# Patient Record
Sex: Male | Born: 1980 | Race: Black or African American | Hispanic: No | Marital: Married | Smoking: Former smoker
Health system: Southern US, Community
[De-identification: ages and names within clinical notes are randomized; demographics above are authoritative.]

## PROBLEM LIST (undated history)

## (undated) DIAGNOSIS — G5611 Other lesions of median nerve, right upper limb: Secondary | ICD-10-CM

## (undated) DIAGNOSIS — E349 Endocrine disorder, unspecified: Secondary | ICD-10-CM

## (undated) DIAGNOSIS — N529 Male erectile dysfunction, unspecified: Secondary | ICD-10-CM

## (undated) DIAGNOSIS — E039 Hypothyroidism, unspecified: Secondary | ICD-10-CM

## (undated) DIAGNOSIS — R112 Nausea with vomiting, unspecified: Secondary | ICD-10-CM

## (undated) DIAGNOSIS — Z9889 Other specified postprocedural states: Secondary | ICD-10-CM

## (undated) DIAGNOSIS — E119 Type 2 diabetes mellitus without complications: Secondary | ICD-10-CM

## (undated) DIAGNOSIS — E2740 Unspecified adrenocortical insufficiency: Secondary | ICD-10-CM

## (undated) DIAGNOSIS — I10 Essential (primary) hypertension: Secondary | ICD-10-CM

## (undated) DIAGNOSIS — K219 Gastro-esophageal reflux disease without esophagitis: Secondary | ICD-10-CM

## (undated) DIAGNOSIS — Z8709 Personal history of other diseases of the respiratory system: Secondary | ICD-10-CM

## (undated) HISTORY — PX: OTHER SURGICAL HISTORY: SHX169

---
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
## 2005-11-30 ENCOUNTER — Emergency Department (HOSPITAL_COMMUNITY): Admission: EM | Admit: 2005-11-30 | Discharge: 2005-11-30 | Payer: Self-pay | Admitting: Emergency Medicine

## 2012-08-23 ENCOUNTER — Encounter (HOSPITAL_COMMUNITY): Payer: Self-pay | Admitting: Family Medicine

## 2012-08-23 ENCOUNTER — Emergency Department (HOSPITAL_COMMUNITY)
Admission: EM | Admit: 2012-08-23 | Discharge: 2012-08-23 | Disposition: A | Discharge status: Home / Self Care | Payer: 59 | Attending: Emergency Medicine | Admitting: Emergency Medicine

## 2012-08-23 DIAGNOSIS — J029 Acute pharyngitis, unspecified: Secondary | ICD-10-CM | POA: Insufficient documentation

## 2012-08-23 DIAGNOSIS — F17200 Nicotine dependence, unspecified, uncomplicated: Secondary | ICD-10-CM | POA: Insufficient documentation

## 2012-08-23 LAB — RAPID STREP SCREEN (MED CTR MEBANE ONLY): Streptococcus, Group A Screen (Direct): NEGATIVE

## 2012-08-23 MED ORDER — IBUPROFEN 800 MG PO TABS: 800.0000 mg | ORAL_TABLET | Freq: Three times a day (TID) | ORAL | Status: DC

## 2012-08-23 MED ORDER — KETOROLAC TROMETHAMINE 60 MG/2ML IM SOLN
60.0000 mg | Freq: Once | INTRAMUSCULAR | Status: AC
  Administered 2012-08-23: 60 mg via INTRAMUSCULAR
  Filled 2012-08-23: qty 2

## 2012-08-23 MED ORDER — PENICILLIN G BENZATHINE 1200000 UNIT/2ML IM SUSP
1.2000 10*6.[IU] | Freq: Once | INTRAMUSCULAR | Status: AC
  Administered 2012-08-23: 1.2 10*6.[IU] via INTRAMUSCULAR
  Filled 2012-08-23: qty 2

## 2012-08-23 NOTE — ED Provider Notes (Signed)
History     CSN: [id number]  Arrival date & time 08/23/12  1824   None     Chief Complaint  Patient presents with  . Sore Throat    (Consider location/radiation/quality/duration/timing/severity/associated sxs/prior treatment) HPI History provided by pt.   Pt presents w/ severe sore throat since yesterday.  Associated w/ fever; max temp 101.0.  No associated nasal congestion, rhinorrhea or cough.  Has not taken anything for sx.  No known sick contacts.  History reviewed. No pertinent past medical history.  History reviewed. No pertinent past surgical history.  History reviewed. No pertinent family history.  History  Substance Use Topics  . Smoking status: Current Some Day Smoker  . Smokeless tobacco: Not on file  . Alcohol Use: Yes      Review of Systems  All other systems reviewed and are negative.    Allergies  Review of patient's allergies indicates no known allergies.  Home Medications   Current Outpatient Rx  Name  Route  Sig  Dispense  Refill  . GUAIFENESIN 100 MG/5ML PO SYRP   Oral   Take 200 mg by mouth 3 (three) times daily as needed. For sore throat.         . IBUPROFEN 800 MG PO TABS   Oral   Take 1 tablet (800 mg total) by mouth 3 (three) times daily.   12 tablet   0     BP 150/80  Pulse 91  Temp 99.5 F (37.5 C) (Oral)  Resp 20  SpO2 96%  Physical Exam  Nursing note and vitals reviewed. Constitutional: He is oriented to person, place, and time. He appears well-developed and well-nourished. No distress.  HENT:  Head: Normocephalic and atraumatic.       Erythema soft palate, tonsils and posterior pharynx.   Mild, symmetric tonsillar edema w/out exudate.  Uvula mid-line and no trismus.   Eyes:       Normal appearance  Neck: Normal range of motion.  Cardiovascular: Normal rate and regular rhythm.   Pulmonary/Chest: Effort normal and breath sounds normal.  Musculoskeletal: Normal range of motion.  Lymphadenopathy:    He has cervical  adenopathy.  Neurological: He is alert and oriented to person, place, and time.  Psychiatric: He has a normal mood and affect. His behavior is normal.    ED Course  Procedures (including critical care time)   Labs Reviewed  RAPID STREP SCREEN   No results found.   1. Pharyngitis       MDM  31yo M presents w/ sore throat.  Based on centor criteria, will treat for strep pharyngitis, despite negative rapid strep screen. Pt received IM bicillin and I recommended ibuprofen for fever and pain at home.  Return precautions discussed.         Otilio Miu, PA-C 08/23/12 2238

## 2012-08-23 NOTE — ED Notes (Signed)
Patient states he has been having a sore throat and a fever of 101.5 with chills since yesterday. He has not taken anything for fever only some cough medicine. Denies emesis but has been nauseated. Denies muscle aches.

## 2012-08-23 NOTE — ED Notes (Signed)
Per pt sore throat since yesterday. Hx of strep and sts it feel the same.

## 2012-08-25 NOTE — ED Provider Notes (Signed)
Medical screening examination/treatment/procedure(s) were performed by non-physician practitioner and as supervising physician I was immediately available for consultation/collaboration.  Chaunda Vandergriff R. Alyzabeth Pontillo, MD 08/25/12 0050 

## 2012-12-25 ENCOUNTER — Emergency Department (INDEPENDENT_AMBULATORY_CARE_PROVIDER_SITE_OTHER)
Admission: EM | Admit: 2012-12-25 | Discharge: 2012-12-25 | Disposition: A | Discharge status: Home / Self Care | Payer: 59 | Source: Home / Self Care | Attending: Emergency Medicine | Admitting: Emergency Medicine

## 2012-12-25 ENCOUNTER — Encounter (HOSPITAL_COMMUNITY): Payer: Self-pay | Admitting: Emergency Medicine

## 2012-12-25 DIAGNOSIS — S139XXA Sprain of joints and ligaments of unspecified parts of neck, initial encounter: Secondary | ICD-10-CM

## 2012-12-25 DIAGNOSIS — M25519 Pain in unspecified shoulder: Secondary | ICD-10-CM

## 2012-12-25 DIAGNOSIS — M25512 Pain in left shoulder: Secondary | ICD-10-CM

## 2012-12-25 DIAGNOSIS — S161XXA Strain of muscle, fascia and tendon at neck level, initial encounter: Secondary | ICD-10-CM

## 2012-12-25 MED ORDER — CYCLOBENZAPRINE HCL 10 MG PO TABS: 10.0000 mg | ORAL_TABLET | Freq: Two times a day (BID) | ORAL | Status: DC | PRN

## 2012-12-25 MED ORDER — MELOXICAM 7.5 MG PO TABS: 7.5000 mg | ORAL_TABLET | Freq: Every day | ORAL | Status: AC

## 2012-12-25 NOTE — ED Notes (Signed)
Reports left shoulder pain that started Wednesday.  Patient unclear of injury, but had been tossing toddler in air.  Afterwards, toddler pushed up on patient, patient noted pain in the shoulder child was pushing on.

## 2012-12-25 NOTE — ED Provider Notes (Signed)
History     CSN: [id number]  Arrival date & time 12/25/12  1235   First MD Initiated Contact with Patient 12/25/12 1405      Chief Complaint  Patient presents with  . Shoulder Pain    (Consider location/radiation/quality/duration/timing/severity/associated sxs/prior treatment) Patient is a 32 y.o. male presenting with shoulder pain. The history is provided by the patient.  Shoulder Pain This is a new problem. The current episode started more than 2 days ago. The problem occurs constantly. The problem has not changed since onset.Pertinent negatives include no chest pain, no abdominal pain, no headaches and no shortness of breath. The symptoms are aggravated by exertion. Nothing relieves the symptoms. He has tried nothing for the symptoms. The treatment provided no relief.    History reviewed. No pertinent past medical history.  History reviewed. No pertinent past surgical history.  No family history on file.  History  Substance Use Topics  . Smoking status: Current Some Day Smoker  . Smokeless tobacco: Not on file  . Alcohol Use: Yes      Review of Systems  Constitutional: Positive for activity change. Negative for fever, chills, diaphoresis, appetite change, fatigue and unexpected weight change.  Respiratory: Negative for shortness of breath.   Cardiovascular: Negative for chest pain.  Gastrointestinal: Negative for abdominal pain.  Musculoskeletal: Negative for myalgias, back pain, joint swelling and arthralgias.  Skin: Negative for color change.  Neurological: Negative for weakness, numbness and headaches.    Allergies  Review of patient's allergies indicates no known allergies.  Home Medications   Current Outpatient Rx  Name  Route  Sig  Dispense  Refill  . cyclobenzaprine (FLEXERIL) 10 MG tablet   Oral   Take 1 tablet (10 mg total) by mouth 2 (two) times daily as needed for muscle spasms.   15 tablet   0   . guaifenesin (ROBITUSSIN) 100 MG/5ML syrup  Oral   Take 200 mg by mouth 3 (three) times daily as needed. For sore throat.         . meloxicam (MOBIC) 7.5 MG tablet   Oral   Take 1 tablet (7.5 mg total) by mouth daily.   10 tablet   0     BP 138/88  Pulse 60  Temp(Src) 98.3 F (36.8 C) (Oral)  Resp 20  SpO2 100%  Physical Exam  Nursing note and vitals reviewed. Constitutional: He appears well-nourished.  Pulmonary/Chest: Effort normal and breath sounds normal.  Musculoskeletal: He exhibits tenderness.       Left shoulder: He exhibits decreased range of motion, tenderness and pain. He exhibits no bony tenderness, no swelling, no effusion, no crepitus, no deformity, no laceration, no spasm, normal pulse and normal strength.       Arms: Neurological: He is alert.  Skin: No rash noted. No erythema.    ED Course  Procedures (including critical care time)  Labs Reviewed - No data to display No results found.   1. Shoulder pain, acute, left   2. Strain of anterolateral cervical muscle, initial encounter       MDM  Patient performing full range of motion- with left shoulder, exam is suggestive of a rotator cuff syndrome and supraspinatus tenderness. Work restrictions were provided the patient. Along with a course of meloxicam with a  muscle relaxer- with Flexeril encouraged patient to followup with the orthopedic provider.        Jimmie Molly, MD 12/25/12 1515

## 2013-04-26 ENCOUNTER — Encounter (HOSPITAL_COMMUNITY): Payer: Self-pay | Admitting: Emergency Medicine

## 2013-04-26 ENCOUNTER — Emergency Department (HOSPITAL_COMMUNITY)
Admission: EM | Admit: 2013-04-26 | Discharge: 2013-04-26 | Disposition: A | Discharge status: Home / Self Care | Payer: 59 | Source: Home / Self Care | Attending: Emergency Medicine | Admitting: Emergency Medicine

## 2013-04-26 DIAGNOSIS — M5431 Sciatica, right side: Secondary | ICD-10-CM

## 2013-04-26 DIAGNOSIS — M5430 Sciatica, unspecified side: Secondary | ICD-10-CM

## 2013-04-26 MED ORDER — METHYLPREDNISOLONE ACETATE 80 MG/ML IJ SUSP
80.0000 mg | Freq: Once | INTRAMUSCULAR | Status: AC
  Administered 2013-04-26: 80 mg via INTRAMUSCULAR

## 2013-04-26 MED ORDER — MELOXICAM 15 MG PO TABS: 15.0000 mg | ORAL_TABLET | Freq: Every day | ORAL | Status: DC

## 2013-04-26 MED ORDER — PREDNISONE 20 MG PO TABS: 20.0000 mg | ORAL_TABLET | Freq: Two times a day (BID) | ORAL | Status: DC

## 2013-04-26 MED ORDER — OXYCODONE-ACETAMINOPHEN 5-325 MG PO TABS: ORAL_TABLET | ORAL | Status: DC

## 2013-04-26 MED ORDER — KETOROLAC TROMETHAMINE 60 MG/2ML IM SOLN
60.0000 mg | Freq: Once | INTRAMUSCULAR | Status: AC
  Administered 2013-04-26: 60 mg via INTRAMUSCULAR

## 2013-04-26 NOTE — ED Provider Notes (Signed)
Chief Complaint:  No chief complaint on file.   History of Present Illness:   Hunter Huynh is a 32 year old male who has had a six-day history of pain in the entire right leg from the groin on down to the foot. Most of the pain is localized to the foot. It feels numb and tingly. The pain is rated 10 worst and now is a 5-6/10. The leg feels somewhat weak. The patient states the foot feels puffy, but I cannot see any definite swelling. The pain is worse with standing or with sitting and nothing makes it better. He had some back pain about 2 weeks ago. This does not go away. He chooses to heavy lifting on his job at [employer]. The patient noted that his wallet would his buttock when he would sit, so he removed it from his right pocket. The patient states he does a lot of driving and sitting. He denies any shortness of breath or chest pain. He's had no fever, chills, or weight loss. He denies any bladder or bowel dysfunction. He has had no history of DVT or phlebitis and no history of prolonged car or plane trips.  Review of Systems:  Other than noted above, the patient denies any of the following symptoms: Systemic:  No fever, chills, sweats, weight gain or loss. Respiratory:  No coughing, wheezing, or shortness of breath. Cardiac:  No chest pain, tightness, pressure or syncope. GI:  No abdominal pain, swelling, distension, nausea, or vomiting. GU:  No dysuria, frequency, or hematuria. Ext:  No joint pain, muscle pain, or weakness. Skin:  No rash or itching. Neuro:  No paresthesias.  PMFSH:  Past medical history, family history, social history, meds, and allergies were reviewed.  He is taking Vicodin, ibuprofen, antibiotic for some dental infection.  Physical Exam:   Vital signs:  BP 140/90  Pulse 75  Temp(Src) 97.9 F (36.6 C) (Oral)  Resp 20  Ht 6\' 3"  (1.905 m)  Wt 287 lb (130.182 kg)  BMI 35.87 kg/m2  SpO2 99% Gen:  Alert, oriented, in no distress. Neck:  No tenderness, adenopathy, or  JVD. Lungs:  Breath sounds clear and equal bilaterally.  No rales, rhonchi or wheezes. Heart:  Regular rhythm, no gallops or murmers. Abdomen:  Soft, nontender, no organomegaly or mass. Ext:  There is no pitting edema, no distended blood vessels. There was no calf tenderness and Homans sign was negative. Straight leg raising was positive on the right with a positive Lasegue's sign and positive popliteal compression sign. Straight leg raising was negative on the left. Neuro:  Alert and oriented times 3.  No muscle weakness.  Sensation intact to light touch. Skin:  Warm and dry.  No rash or skin lesions.  Course in Urgent Care Center:  Given Toradol 60 mg IM and Depo-Medrol 80 mg IM.  Assessment:  The encounter diagnosis was Sciatica, right.  History and physical exam are suggestive of sciatica, possibly due to irritation of sciatic nerve from his wallet. The patient was told to keep the wall of the pocket and to try to avoid prolonged sitting or if he was given some back exercises. I do not see anything that makes me suspicious of DVT. I recommended following up with orthopedics in a week.  Plan:   1.  The following meds were prescribed:   Discharge Medication List as of 04/26/2013 10:17 AM    START taking these medications   Details  meloxicam (MOBIC) 15 MG tablet Take 1 tablet (  15 mg total) by mouth daily., Starting 04/26/2013, Until Discontinued, Normal    oxyCODONE-acetaminophen (PERCOCET) 5-325 MG per tablet 1 to 2 tablets every 6 hours as needed for pain., Print    predniSONE (DELTASONE) 20 MG tablet Take 1 tablet (20 mg total) by mouth 2 (two) times daily., Starting 04/26/2013, Until Discontinued, Normal       2.  The patient was instructed in symptomatic care and handouts were given. 3.  The patient was told to return if becoming worse in any way, if no better in 3 or 4 days, and given some red flag symptoms such as worsening pain or new neurological symptoms or any shortness of breath  or chest pain that would indicate earlier return. 4.  Follow up with Dr. Renaye Rakers in one week.    Reuben Likes, MD 04/26/13 [phone]

## 2013-04-26 NOTE — ED Notes (Signed)
Patient discharged by [place], cma.

## 2013-04-26 NOTE — ED Notes (Signed)
See physician notation

## 2013-04-26 NOTE — ED Notes (Signed)
Patient discharged at 14 by [place], cma.  Patient's son also in department being seen.

## 2013-05-08 ENCOUNTER — Emergency Department (HOSPITAL_COMMUNITY)
Admission: EM | Admit: 2013-05-08 | Discharge: 2013-05-08 | Disposition: A | Discharge status: Home / Self Care | Payer: 59 | Source: Home / Self Care

## 2013-05-08 ENCOUNTER — Emergency Department (INDEPENDENT_AMBULATORY_CARE_PROVIDER_SITE_OTHER): Payer: 59

## 2013-05-08 ENCOUNTER — Encounter (HOSPITAL_COMMUNITY): Payer: Self-pay | Admitting: Emergency Medicine

## 2013-05-08 DIAGNOSIS — IMO0002 Reserved for concepts with insufficient information to code with codable children: Secondary | ICD-10-CM

## 2013-05-08 DIAGNOSIS — M25519 Pain in unspecified shoulder: Secondary | ICD-10-CM

## 2013-05-08 DIAGNOSIS — S139XXA Sprain of joints and ligaments of unspecified parts of neck, initial encounter: Secondary | ICD-10-CM

## 2013-05-08 DIAGNOSIS — M5410 Radiculopathy, site unspecified: Secondary | ICD-10-CM

## 2013-05-08 MED ORDER — MELOXICAM 15 MG PO TABS: 15.0000 mg | ORAL_TABLET | Freq: Every day | ORAL | Status: DC

## 2013-05-08 MED ORDER — OXYCODONE-ACETAMINOPHEN 5-325 MG PO TABS: ORAL_TABLET | ORAL | Status: DC

## 2013-05-08 NOTE — ED Notes (Signed)
Pt is here to have his percocets refilled for pain of right leg Reports he was seen here on 8/7 and dx w/sciatica of right side States he has an appt coming up in September w/specialist??? But has run out of his meds Denies any new problems... Alert w/no signs of acute distress.

## 2013-05-08 NOTE — ED Provider Notes (Signed)
Hunter Huynh is a 32 y.o. male who presents to Urgent Care today for right leg sciatica pain. Patient was seen August 7 for right leg sciatica pain. He was treated with prednisone, meloxicam, and oxycodone. His pain has not improved and in fact has worsened a bit. He is an appointment September 5 with orthopedics. He has run out of his oxycodone and notes the pain is quite bothersome. He rates the pain is moderate to severe. He denies any weakness numbness bowel bladder dysfunction or difficulty walking. The pain is worse when sitting or bending forward and better with standing. He has been able to work throughout this complaint. He denies any nausea vomiting or diarrhea. The pain improves with meloxicam, activity, and oxycodone.    PMH reviewed. Otherwise healthy History  Substance Use Topics  . Smoking status: Current Some Day Smoker  . Smokeless tobacco: Not on file  . Alcohol Use: Yes   ROS as above Medications reviewed. No current facility-administered medications for this encounter.   Current Outpatient Prescriptions  Medication Sig Dispense Refill  . oxyCODONE-acetaminophen (PERCOCET) 5-325 MG per tablet 1 to 2 tablets every 6 hours as needed for pain.  30 tablet  0  . cyclobenzaprine (FLEXERIL) 10 MG tablet Take 1 tablet (10 mg total) by mouth 2 (two) times daily as needed for muscle spasms.  15 tablet  0  . meloxicam (MOBIC) 15 MG tablet Take 1 tablet (15 mg total) by mouth daily.  15 tablet  0    Exam:  BP 143/93  Pulse 103  Temp(Src) 97.6 F (36.4 C) (Oral)  Resp 20  SpO2 98% Gen: Well NAD Back. Nontender to spinal midline with normal back range of motion Positive straight leg raise test 4/5 strength right hip flexor compared to 5/5 in the left Strength is intact otherwise.  Sensation is intact distal lateral lower extremities No edema bilateral lower extremities   No results found for this or any previous visit (from the past 24 hour(s)). Dg Lumbar Spine  Complete  05/08/2013   *RADIOLOGY REPORT*  Clinical Data: Right leg pain.  Low back pain  LUMBAR SPINE - COMPLETE 4+ VIEW  Comparison: None  Findings: Mild compressive deformity of T12 with anterior spurring. This is probably an old fracture but correlation with pain location is suggested.  No other fracture is identified  Moderate disc space narrowing L3-4.  Mild disc space narrowing L4-5 and L5-S1.  Negative for pars defect.  IMPRESSION: Mild compressive fracture T12 which appears chronic.  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   Original Report Authenticated By: Janeece Riggers, M.D.    Assessment and Plan: 32 y.o. male with worsening right-sided sciatica. Patient has an appointment with his orthopedist on September 5. Plan: Obtain a lumbar MRI, to aid the diagnosis of this issue with his orthopedist. This is scheduled and per authorized for Saturday, August 23 at 8 AM.  The preauthorization number is [id number]  Refill Percocet and meloxicam.  Discussed warning signs or symptoms. Please see discharge instructions. Patient expresses understanding.      Rodolph Bong, MD 05/08/13 [phone]

## 2013-05-08 NOTE — ED Notes (Signed)
Pt verbalized understanding of MRI appt on 8/23 at 0800 sched by Dr. Denyse Amass at [facility]

## 2013-05-12 ENCOUNTER — Ambulatory Visit
Admit: 2013-05-12 | Discharge: 2013-05-12 | Disposition: A | Discharge status: Home / Self Care | Payer: 59 | Attending: Family Medicine | Admitting: Family Medicine

## 2016-01-23 ENCOUNTER — Encounter (HOSPITAL_COMMUNITY): Payer: Self-pay

## 2016-01-23 ENCOUNTER — Emergency Department (HOSPITAL_COMMUNITY): Payer: Self-pay

## 2016-01-23 ENCOUNTER — Emergency Department (HOSPITAL_COMMUNITY)
Admission: EM | Admit: 2016-01-23 | Discharge: 2016-01-24 | Disposition: A | Discharge status: Home / Self Care | Payer: Self-pay | Attending: Emergency Medicine | Admitting: Emergency Medicine

## 2016-01-23 DIAGNOSIS — H9203 Otalgia, bilateral: Secondary | ICD-10-CM | POA: Insufficient documentation

## 2016-01-23 DIAGNOSIS — J020 Streptococcal pharyngitis: Secondary | ICD-10-CM | POA: Insufficient documentation

## 2016-01-23 DIAGNOSIS — I10 Essential (primary) hypertension: Secondary | ICD-10-CM | POA: Insufficient documentation

## 2016-01-23 DIAGNOSIS — F17200 Nicotine dependence, unspecified, uncomplicated: Secondary | ICD-10-CM | POA: Insufficient documentation

## 2016-01-23 HISTORY — DX: Essential (primary) hypertension: I10

## 2016-01-23 HISTORY — DX: Personal history of other diseases of the respiratory system: Z87.09

## 2016-01-23 LAB — CBC WITH DIFFERENTIAL/PLATELET
Basophils Absolute: 0 10*3/uL (ref 0.0–0.1)
Basophils Relative: 0 %
Eosinophils Absolute: 0 10*3/uL (ref 0.0–0.7)
Eosinophils Relative: 0 %
HCT: 43.5 % (ref 39.0–52.0)
HEMOGLOBIN: 14.6 g/dL (ref 13.0–17.0)
LYMPHS ABS: 1.3 10*3/uL (ref 0.7–4.0)
LYMPHS PCT: 6 %
MCH: 29.6 pg (ref 26.0–34.0)
MCHC: 33.6 g/dL (ref 30.0–36.0)
MCV: 88.2 fL (ref 78.0–100.0)
Monocytes Absolute: 2.1 10*3/uL — ABNORMAL HIGH (ref 0.1–1.0)
Monocytes Relative: 10 %
NEUTROS PCT: 84 %
Neutro Abs: 18.4 10*3/uL — ABNORMAL HIGH (ref 1.7–7.7)
Platelets: 308 10*3/uL (ref 150–400)
RBC: 4.93 MIL/uL (ref 4.22–5.81)
RDW: 13.7 % (ref 11.5–15.5)
WBC: 21.8 10*3/uL — AB (ref 4.0–10.5)

## 2016-01-23 LAB — COMPREHENSIVE METABOLIC PANEL
ALT: 21 U/L (ref 17–63)
ANION GAP: 13 (ref 5–15)
AST: 17 U/L (ref 15–41)
Albumin: 4.2 g/dL (ref 3.5–5.0)
Alkaline Phosphatase: 68 U/L (ref 38–126)
BUN: 10 mg/dL (ref 6–20)
CHLORIDE: 100 mmol/L — AB (ref 101–111)
CO2: 24 mmol/L (ref 22–32)
Calcium: 9.7 mg/dL (ref 8.9–10.3)
Creatinine, Ser: 1.1 mg/dL (ref 0.61–1.24)
GFR calc non Af Amer: >60 mL/min (ref >60)
Glucose, Bld: 109 mg/dL — ABNORMAL HIGH (ref 65–99)
POTASSIUM: 3.7 mmol/L (ref 3.5–5.1)
SODIUM: 137 mmol/L (ref 135–145)
Total Bilirubin: 2.3 mg/dL — ABNORMAL HIGH (ref 0.3–1.2)
Total Protein: 8 g/dL (ref 6.5–8.1)

## 2016-01-23 LAB — I-STAT CG4 LACTIC ACID, ED: Lactic Acid, Venous: 0.99 mmol/L (ref 0.5–2.0)

## 2016-01-23 LAB — RAPID STREP SCREEN (MED CTR MEBANE ONLY): STREPTOCOCCUS, GROUP A SCREEN (DIRECT): POSITIVE — AB

## 2016-01-23 NOTE — ED Notes (Signed)
Pt c/o 10/10 sore throat and bilateral ear pain. Pt diaphoretic, tachycardic and hypertensive in triage. Pt states he has a hx of HTN but cannot afford meds. Pt A+OX4, speaking in complete sentences, ambulatory to triage.

## 2016-01-24 LAB — URINALYSIS, ROUTINE W REFLEX MICROSCOPIC
BILIRUBIN URINE: NEGATIVE
GLUCOSE, UA: NEGATIVE mg/dL
KETONES UR: 40 mg/dL — AB
LEUKOCYTES UA: NEGATIVE
NITRITE: NEGATIVE
PROTEIN: 30 mg/dL — AB
Specific Gravity, Urine: 1.022 (ref 1.005–1.030)
pH: 6 (ref 5.0–8.0)

## 2016-01-24 LAB — URINE MICROSCOPIC-ADD ON

## 2016-01-24 MED ORDER — PENICILLIN G BENZATHINE 1200000 UNIT/2ML IM SUSP
1.2000 10*6.[IU] | Freq: Once | INTRAMUSCULAR | Status: AC
  Administered 2016-01-24: 1.2 10*6.[IU] via INTRAMUSCULAR
  Filled 2016-01-24: qty 2

## 2016-01-24 NOTE — ED Provider Notes (Signed)
CSN: [id number]     Arrival date & time 01/23/16  2229 History  By signing my name below, I, Hunter Huynh, attest that this documentation has been prepared under the direction and in the presence of Dorie Rank, MD. Electronically Signed: Judithann Sauger, ED Scribe. 01/24/2016. 1:02 AM.    Chief Complaint  Patient presents with  . Sore Throat  . Ear Pain   Patient is a 35 y.o. male presenting with pharyngitis. The history is provided by the patient. No language interpreter was used.  Sore Throat This is a new problem. The current episode started 2 days ago. The problem has been gradually worsening. Pertinent negatives include no headaches and no shortness of breath. Nothing aggravates the symptoms. Nothing relieves the symptoms. He has tried nothing for the symptoms.   HPI Comments: Hunter Huynh is a 35 y.o. male who presents to the Emergency Department complaining of sore throat onset 2 days ago. Pt reports associated trouble swallowing, subjective fever, and non prdocutive cough onset yesterday.  No alleviating factors noted. Pt has not tried any medications PTA. Pt currently has high blood pressure and he states that he is unable to afford his HTN medications. He reports NKDA. He denies any n/v or chills.    Past Medical History  Diagnosis Date  . Hypertension   . History of streptococcal sore throat    History reviewed. No pertinent past surgical history. History reviewed. No pertinent family history. Social History  Substance Use Topics  . Smoking status: Current Every Day Smoker  . Smokeless tobacco: Never Used  . Alcohol Use: Yes    Review of Systems  Constitutional: Positive for fever. Negative for chills.  HENT: Positive for sore throat and trouble swallowing.   Respiratory: Positive for cough. Negative for shortness of breath.   Gastrointestinal: Negative for nausea and vomiting.  Neurological: Negative for headaches.      Allergies  Review of patient's  allergies indicates no known allergies.  Home Medications   Prior to Admission medications   Medication Sig Start Date End Date Taking? Authorizing Provider  acetaminophen (TYLENOL) 325 MG tablet Take 1,300 mg by mouth every 4 (four) hours as needed (for pain.).    Yes Historical Provider, MD   BP 156/104 mmHg  Pulse 117  Temp(Src) 98.2 F (36.8 C) (Oral)  Resp 20  Ht 6\' 3"  (1.905 m)  Wt 153.769 kg  BMI 42.37 kg/m2  SpO2 94% Physical Exam  Constitutional: He appears well-developed and well-nourished. No distress.  HENT:  Head: Normocephalic and atraumatic.  Right Ear: External ear normal.  Left Ear: External ear normal.  Mouth/Throat: Mucous membranes are normal. No trismus in the jaw. No uvula swelling. Posterior oropharyngeal edema and posterior oropharyngeal erythema present. No oropharyngeal exudate or tonsillar abscesses.  Eyes: Conjunctivae are normal. Right eye exhibits no discharge. Left eye exhibits no discharge. No scleral icterus.  Neck: Neck supple. No tracheal deviation present.  Cardiovascular: Normal rate, regular rhythm and intact distal pulses.   Pulmonary/Chest: Effort normal and breath sounds normal. No stridor. No respiratory distress. He has no wheezes. He has no rales.  Abdominal: Soft. Bowel sounds are normal. He exhibits no distension. There is no tenderness. There is no rebound and no guarding.  Musculoskeletal: He exhibits no edema or tenderness.  Neurological: He is alert. He has normal strength. No cranial nerve deficit (no facial droop, extraocular movements intact, no slurred speech) or sensory deficit. He exhibits normal muscle tone. He displays no seizure activity.  Coordination normal.  Skin: Skin is warm and dry. No rash noted.  Psychiatric: He has a normal mood and affect.  Nursing note and vitals reviewed.   ED Course  Procedures (including critical care time) DIAGNOSTIC STUDIES: Oxygen Saturation is 94% on RA, adequate by my interpretation.     COORDINATION OF CARE: 12:48 AM- Pt advised of plan for treatment and pt agrees. Pt informed of strep and lab results. He will receive a Penicillin IM.    Labs Review Labs Reviewed  RAPID STREP SCREEN (NOT AT [facility]) - Abnormal; Notable for the following:    Streptococcus, Group A Screen (Direct) POSITIVE (*)    All other components within normal limits  COMPREHENSIVE METABOLIC PANEL - Abnormal; Notable for the following:    Chloride 100 (*)    Glucose, Bld 109 (*)    Total Bilirubin 2.3 (*)    All other components within normal limits  CBC WITH DIFFERENTIAL/PLATELET - Abnormal; Notable for the following:    WBC 21.8 (*)    Neutro Abs 18.4 (*)    Monocytes Absolute 2.1 (*)    All other components within normal limits  URINALYSIS, ROUTINE W REFLEX MICROSCOPIC (NOT AT [facility]) - Abnormal; Notable for the following:    Color, Urine AMBER (*)    Hgb urine dipstick SMALL (*)    Ketones, ur 40 (*)    Protein, ur 30 (*)    All other components within normal limits  URINE MICROSCOPIC-ADD ON - Abnormal; Notable for the following:    Squamous Epithelial / LPF 0-5 (*)    Bacteria, UA RARE (*)    All other components within normal limits  CULTURE, BLOOD (ROUTINE X 2)  CULTURE, BLOOD (ROUTINE X 2)  URINE CULTURE  I-STAT CG4 LACTIC ACID, ED    Imaging Review Dg Chest 2 View  01/23/2016  CLINICAL DATA:  Sepsis EXAM: CHEST  2 VIEW COMPARISON:  11/30/2005 FINDINGS: The lungs are clear. The pulmonary vasculature is normal. Heart size is normal. Hilar and mediastinal contours are unremarkable. There is no pleural effusion. IMPRESSION: No active cardiopulmonary disease. Electronically Signed   By: Andreas Newport M.D.   On: 01/23/2016 23:15   Dorie Rank, MD has personally reviewed and evaluated these images and lab results as part of his medical decision-making.  Medications  penicillin g benzathine (BICILLIN LA) 1200000 UNIT/2ML injection 1.2 Million Units (not administered)     MDM   Final  diagnoses:  Strep pharyngitis  Essential hypertension    Patient presented to the emergency room with complaints of sore throat and fevers. He has not measured any fevers at home however. Strep test is positive. Patient's symptoms are consistent with a strep pharyngitis. No evidence of abscess on exam.  Patient has  a significant leukocytosis but he is not toxic appearing. He is not hypotensive. Lactic acid normal. I doubt sepsis.  Pt has history of htn.  I discussed outpatient follow up.  Consider [place] and wellness.  I personally performed the services described in this documentation, which was scribed in my presence.  The recorded information has been reviewed and is accurate.   Dorie Rank, MD 01/24/16 [phone]

## 2016-01-24 NOTE — Discharge Instructions (Signed)
Hypertension Hypertension, commonly called high blood pressure, is when the force of blood pumping through your arteries is too strong. Your arteries are the blood vessels that carry blood from your heart throughout your body. A blood pressure reading consists of a higher number over a lower number, such as 110/72. The higher number (systolic) is the pressure inside your arteries when your heart pumps. The lower number (diastolic) is the pressure inside your arteries when your heart relaxes. Ideally you want your blood pressure below 120/80. Hypertension forces your heart to work harder to pump blood. Your arteries may become narrow or stiff. Having untreated or uncontrolled hypertension can cause heart attack, stroke, kidney disease, and other problems. RISK FACTORS Some risk factors for high blood pressure are controllable. Others are not.  Risk factors you cannot control include:   Race. You may be at higher risk if you are African American.  Age. Risk increases with age.  Gender. Men are at higher risk than women before age 45 years. After age 65, women are at higher risk than men. Risk factors you can control include:  Not getting enough exercise or physical activity.  Being overweight.  Getting too much fat, sugar, calories, or salt in your diet.  Drinking too much alcohol. SIGNS AND SYMPTOMS Hypertension does not usually cause signs or symptoms. Extremely high blood pressure (hypertensive crisis) may cause headache, anxiety, shortness of breath, and nosebleed. DIAGNOSIS To check if you have hypertension, your health care provider will measure your blood pressure while you are seated, with your arm held at the level of your heart. It should be measured at least twice using the same arm. Certain conditions can cause a difference in blood pressure between your right and left arms. A blood pressure reading that is higher than normal on one occasion does not mean that you need treatment. If  it is not clear whether you have high blood pressure, you may be asked to return on a different day to have your blood pressure checked again. Or, you may be asked to monitor your blood pressure at home for 1 or more weeks. TREATMENT Treating high blood pressure includes making lifestyle changes and possibly taking medicine. Living a healthy lifestyle can help lower high blood pressure. You may need to change some of your habits. Lifestyle changes may include:  Following the DASH diet. This diet is high in fruits, vegetables, and whole grains. It is low in salt, red meat, and added sugars.  Keep your sodium intake below 2,300 mg per day.  Getting at least 30-45 minutes of aerobic exercise at least 4 times per week.  Losing weight if necessary.  Not smoking.  Limiting alcoholic beverages.  Learning ways to reduce stress. Your health care provider may prescribe medicine if lifestyle changes are not enough to get your blood pressure under control, and if one of the following is true:  You are 18-59 years of age and your systolic blood pressure is above 140.  You are 60 years of age or older, and your systolic blood pressure is above 150.  Your diastolic blood pressure is above 90.  You have diabetes, and your systolic blood pressure is over 140 or your diastolic blood pressure is over 90.  You have kidney disease and your blood pressure is above 140/90.  You have heart disease and your blood pressure is above 140/90. Your personal target blood pressure may vary depending on your medical conditions, your age, and other factors. HOME CARE INSTRUCTIONS    Have your blood pressure rechecked as directed by your health care provider.   Take medicines only as directed by your health care provider. Follow the directions carefully. Blood pressure medicines must be taken as prescribed. The medicine does not work as well when you skip doses. Skipping doses also puts you at risk for  problems.  Do not smoke.   Monitor your blood pressure at home as directed by your health care provider. SEEK MEDICAL CARE IF:   You think you are having a reaction to medicines taken.  You have recurrent headaches or feel dizzy.  You have swelling in your ankles.  You have trouble with your vision. SEEK IMMEDIATE MEDICAL CARE IF:  You develop a severe headache or confusion.  You have unusual weakness, numbness, or feel faint.  You have severe chest or abdominal pain.  You vomit repeatedly.  You have trouble breathing. MAKE SURE YOU:   Understand these instructions.  Will watch your condition.  Will get help right away if you are not doing well or get worse.   This information is not intended to replace advice given to you by your health care provider. Make sure you discuss any questions you have with your health care provider.   Document Released: 09/06/2005 Document Revised: 01/21/2015 Document Reviewed: 06/29/2013 Elsevier Interactive Patient Education 2016 Elsevier Inc. Strep Throat Strep throat is a bacterial infection of the throat. Your health care provider may call the infection tonsillitis or pharyngitis, depending on whether there is swelling in the tonsils or at the back of the throat. Strep throat is most common during the cold months of the year in children who are 54-19 years of age, but it can happen during any season in people of any age. This infection is spread from person to person (contagious) through coughing, sneezing, or close contact. CAUSES Strep throat is caused by the bacteria called Streptococcus pyogenes. RISK FACTORS This condition is more likely to develop in:  People who spend time in crowded places where the infection can spread easily.  People who have close contact with someone who has strep throat. SYMPTOMS Symptoms of this condition include:  Fever or chills.   Redness, swelling, or pain in the tonsils or throat.  Pain or  difficulty when swallowing.  White or yellow spots on the tonsils or throat.  Swollen, tender glands in the neck or under the jaw.  Red rash all over the body (rare). DIAGNOSIS This condition is diagnosed by performing a rapid strep test or by taking a swab of your throat (throat culture test). Results from a rapid strep test are usually ready in a few minutes, but throat culture test results are available after one or two days. TREATMENT This condition is treated with antibiotic medicine. HOME CARE INSTRUCTIONS Medicines  Take over-the-counter and prescription medicines only as told by your health care provider.  Take your antibiotic as told by your health care provider. Do not stop taking the antibiotic even if you start to feel better.  Have family members who also have a sore throat or fever tested for strep throat. They may need antibiotics if they have the strep infection. Eating and Drinking  Do not share food, drinking cups, or personal items that could cause the infection to spread to other people.  If swallowing is difficult, try eating soft foods until your sore throat feels better.  Drink enough fluid to keep your urine clear or pale yellow. General Instructions  Gargle with a salt-water mixture  3-4 times per day or as needed. To make a salt-water mixture, completely dissolve -1 tsp of salt in 1 cup of warm water.  Make sure that all household members wash their hands well.  Get plenty of rest.  Stay home from school or work until you have been taking antibiotics for 24 hours.  Keep all follow-up visits as told by your health care provider. This is important. SEEK MEDICAL CARE IF:  The glands in your neck continue to get bigger.  You develop a rash, cough, or earache.  You cough up a thick liquid that is green, yellow-brown, or bloody.  You have pain or discomfort that does not get better with medicine.  Your problems seem to be getting worse rather than  better.  You have a fever. SEEK IMMEDIATE MEDICAL CARE IF:  You have new symptoms, such as vomiting, severe headache, stiff or painful neck, chest pain, or shortness of breath.  You have severe throat pain, drooling, or changes in your voice.  You have swelling of the neck, or the skin on the neck becomes red and tender.  You have signs of dehydration, such as fatigue, dry mouth, and decreased urination.  You become increasingly sleepy, or you cannot wake up completely.  Your joints become red or painful.   This information is not intended to replace advice given to you by your health care provider. Make sure you discuss any questions you have with your health care provider.   Document Released: 09/03/2000 Document Revised: 05/28/2015 Document Reviewed: 12/30/2014 Elsevier Interactive Patient Education [employer].

## 2016-01-24 NOTE — ED Notes (Signed)
Pt reports some fevers and sore throat since Thursday.  Pt alert and oriented.  Family at bedside.  NAD.  98.2 temp at current.

## 2016-01-25 LAB — URINE CULTURE

## 2016-01-29 LAB — CULTURE, BLOOD (ROUTINE X 2)
CULTURE: NO GROWTH
Culture: NO GROWTH

## 2017-03-11 ENCOUNTER — Emergency Department (HOSPITAL_COMMUNITY)
Admission: EM | Admit: 2017-03-11 | Discharge: 2017-03-11 | Disposition: A | Discharge status: Home / Self Care | Payer: Self-pay | Attending: Emergency Medicine | Admitting: Emergency Medicine

## 2017-03-11 ENCOUNTER — Encounter (HOSPITAL_COMMUNITY): Payer: Self-pay | Admitting: *Deleted

## 2017-03-11 DIAGNOSIS — J029 Acute pharyngitis, unspecified: Secondary | ICD-10-CM | POA: Insufficient documentation

## 2017-03-11 DIAGNOSIS — F17210 Nicotine dependence, cigarettes, uncomplicated: Secondary | ICD-10-CM | POA: Insufficient documentation

## 2017-03-11 DIAGNOSIS — I10 Essential (primary) hypertension: Secondary | ICD-10-CM | POA: Insufficient documentation

## 2017-03-11 DIAGNOSIS — R509 Fever, unspecified: Secondary | ICD-10-CM | POA: Insufficient documentation

## 2017-03-11 MED ORDER — AMOXICILLIN 500 MG PO CAPS
1000.0000 mg | ORAL_CAPSULE | Freq: Once | ORAL | Status: AC
  Administered 2017-03-11: 1000 mg via ORAL
  Filled 2017-03-11: qty 2

## 2017-03-11 MED ORDER — AMOXICILLIN 500 MG PO CAPS: 1000.0000 mg | ORAL_CAPSULE | Freq: Two times a day (BID) | ORAL | 0 refills | Status: DC

## 2017-03-11 NOTE — ED Triage Notes (Signed)
Pt reports pain upon swallowing.  Pt reports having strep before and this is similar.

## 2017-03-11 NOTE — ED Provider Notes (Signed)
[facility] DEPT Provider Note   CSN: [id number] Arrival date & time: 03/11/17  1208  By signing my name below, I, Hunter Huynh, attest that this documentation has been prepared under the direction and in the presence of [employer], PA-C. Electronically Signed: Mayer Huynh, Scribe. 03/11/17. 7:56 PM  History   Chief Complaint Chief Complaint  Patient presents with  . Sore Throat  The history is provided by the patient. No language interpreter was used.    HPI Comments:  Hunter Huynh is a 36 y.o. male who presents to the Emergency Department complaining of constant, gradually worsening sore throat since last night. Pt has associated mild subjective fever. He has not taken any medications. He has had strep throat every year for the past 3 years. He denies cough and rhinorrhea.  Past Medical History:  Diagnosis Date  . History of streptococcal sore throat   . Hypertension     There are no active problems to display for this patient.   History reviewed. No pertinent surgical history.     Home Medications    Prior to Admission medications   Medication Sig Start Date End Date Taking? Authorizing Provider  acetaminophen (TYLENOL) 325 MG tablet Take 1,300 mg by mouth every 4 (four) hours as needed (for pain.).     [provider]  amoxicillin (AMOXIL) 500 MG capsule Take 2 capsules (1,000 mg total) by mouth 2 (two) times daily. 03/11/17   Doriana Mazurkiewicz, Charna Elizabeth    Family History No family history on file.  Social History Social History  Substance Use Topics  . Smoking status: Current Every Day Smoker    Packs/day: 0.50    Types: Cigarettes  . Smokeless tobacco: Never Used  . Alcohol use Yes     Allergies   Patient has no known allergies.   Review of Systems Review of Systems A complete 10 system review of systems was obtained and all systems are negative except as noted in the HPI and PMH.   Physical Exam Updated Vital Signs BP 131/90 (BP  Location: Right Arm)   Pulse 78   Temp 98.9 F (37.2 C) (Oral)   Resp 16   SpO2 100%   Physical Exam  Constitutional: He is oriented to person, place, and time. He appears well-developed and well-nourished.  HENT:  Head: Normocephalic and atraumatic.  Right Ear: External ear normal.  Left Ear: External ear normal.  Mouth/Throat: Oropharynx is clear and moist. No oropharyngeal exudate.  No drooling or stridor. Tonsillar hypertrophy 2+ with exudate, uvula is midline. Soft palate rises symmetrically. No TTP or induration under tongue.   No tenderness to palpation of frontal or bilateral maxillary sinuses.   Mild mucosal edema in the nares with scant rhinorrhea.  Bilateral tympanic membranes with normal architecture and good light reflex.    Eyes: Conjunctivae and EOM are normal. Pupils are equal, round, and reactive to light.  Neck: Normal range of motion. Neck supple.  Cardiovascular: Normal rate and regular rhythm.   Pulmonary/Chest: Effort normal and breath sounds normal. No stridor. No respiratory distress. He has no wheezes. He has no rales. He exhibits no tenderness.  Abdominal: Soft. There is no tenderness. There is no rebound and no guarding.  Neurological: He is alert and oriented to person, place, and time.  Skin: Skin is warm and dry.  Psychiatric: He has a normal mood and affect.  Nursing note and vitals reviewed.    ED Treatments / Results  DIAGNOSTIC STUDIES: Oxygen Saturation is  100% on RA, normal by my interpretation.    COORDINATION OF CARE: 1:07 PM Discussed treatment plan with pt at bedside and pt agreed to plan.  Labs (all labs ordered are listed, but only abnormal results are displayed) Labs Reviewed - No data to display  EKG  EKG Interpretation None       Radiology No results found.  Procedures Procedures (including critical care time)  Medications Ordered in ED Medications  amoxicillin (AMOXIL) capsule 1,000 mg (1,000 mg Oral Given  03/11/17 1326)     Initial Impression / Assessment and Plan / ED Course  I have reviewed the triage vital signs and the nursing notes.  Pertinent labs & imaging results that were available during my care of the patient were reviewed by me and considered in my medical decision making (see chart for details).     Vitals:   03/11/17 1239  BP: 131/90  Pulse: 78  Resp: 16  Temp: 98.9 F (37.2 C)  TempSrc: Oral  SpO2: 100%    Medications  amoxicillin (AMOXIL) capsule 1,000 mg (1,000 mg Oral Given 03/11/17 1326)    Hunter Huynh is 36 y.o. male presenting with sore throat. Physical exam consistent with strep pharyngitis. Patient started on amoxicillin. No signs of deep space infection.  Evaluation does not show pathology that would require ongoing emergent intervention or inpatient treatment. Pt is hemodynamically stable and mentating appropriately. Discussed findings and plan with patient/guardian, who agrees with care plan. All questions answered. Return precautions discussed and outpatient follow up given.    Final Clinical Impressions(s) / ED Diagnoses   Final diagnoses:  Pharyngitis, unspecified etiology    New Prescriptions Discharge Medication List as of 03/11/2017  1:10 PM    START taking these medications   Details  amoxicillin (AMOXIL) 500 MG capsule Take 2 capsules (1,000 mg total) by mouth 2 (two) times daily., Starting Fri 03/11/2017, Print       I personally performed the services described in this documentation, which was scribed in my presence. The recorded information has been reviewed and is accurate.    Hunter Huynh 03/11/17 1956    Charlesetta Shanks, MD 03/25/17 [phone]

## 2017-03-11 NOTE — Discharge Instructions (Addendum)
For fever and pain control, please take Ibuprofen (also known as Motrin or Advil) 400mg  (this is normally 2 over the counter pills) every 6 hours. Take with food to minimize stomach irritation.

## 2017-10-13 ENCOUNTER — Encounter (HOSPITAL_COMMUNITY): Payer: Self-pay

## 2017-10-13 ENCOUNTER — Emergency Department (HOSPITAL_COMMUNITY)
Admission: EM | Admit: 2017-10-13 | Discharge: 2017-10-13 | Disposition: A | Discharge status: Home / Self Care | Payer: Self-pay | Attending: Emergency Medicine | Admitting: Emergency Medicine

## 2017-10-13 ENCOUNTER — Other Ambulatory Visit: Payer: Self-pay

## 2017-10-13 DIAGNOSIS — Z87891 Personal history of nicotine dependence: Secondary | ICD-10-CM | POA: Insufficient documentation

## 2017-10-13 DIAGNOSIS — I10 Essential (primary) hypertension: Secondary | ICD-10-CM | POA: Insufficient documentation

## 2017-10-13 DIAGNOSIS — J029 Acute pharyngitis, unspecified: Secondary | ICD-10-CM

## 2017-10-13 DIAGNOSIS — Z79899 Other long term (current) drug therapy: Secondary | ICD-10-CM | POA: Insufficient documentation

## 2017-10-13 MED ORDER — PENICILLIN G BENZATHINE 1200000 UNIT/2ML IM SUSP
1.2000 10*6.[IU] | Freq: Once | INTRAMUSCULAR | Status: AC
  Administered 2017-10-13: 1.2 10*6.[IU] via INTRAMUSCULAR
  Filled 2017-10-13: qty 2

## 2017-10-13 MED ORDER — HYDROCODONE-ACETAMINOPHEN 5-325 MG PO TABS: 1.0000 | ORAL_TABLET | ORAL | 0 refills | Status: DC | PRN

## 2017-10-13 MED ORDER — DEXAMETHASONE 1 MG/ML PO CONC
10.0000 mg | Freq: Once | ORAL | Status: DC
  Filled 2017-10-13: qty 10

## 2017-10-13 NOTE — ED Triage Notes (Addendum)
Patient c/o sore throat and fever x 2 days. Patient states he is unable to swallow saliva due to pain.

## 2017-10-13 NOTE — ED Notes (Signed)
ED Provider at bedside. 

## 2017-10-13 NOTE — Discharge Instructions (Signed)
Your blood pressure was also elevated today.  Follow-up with the primary care doctor for further evaluation and monitoring of this.

## 2017-10-13 NOTE — ED Provider Notes (Signed)
[facility] DEPT Provider Note   CSN: [id number] Arrival date & time: 10/13/17  6948     History   Chief Complaint Chief Complaint  Patient presents with  . Sore Throat  . Fever    HPI Hunter Huynh is a 37 y.o. male.  HPI Patient presents with sore throat.  Has had for the last few days.  Now getting worse.  Worse with swallowing.  Began on right side now involving both sides.  States he has difficulty swallowing due to the pain.  States it feels as if some food get stuck.  Has had fevers at home.  Has a history of rather frequent strep throat.  States he was in the same room as a child with strep throat recently.  No cough.  No nausea or vomiting.  No difficulty breathing. Past Medical History:  Diagnosis Date  . History of streptococcal sore throat   . Hypertension     There are no active problems to display for this patient.   History reviewed. No pertinent surgical history.     Home Medications    Prior to Admission medications   Medication Sig Start Date End Date Taking? Authorizing Provider  acetaminophen (TYLENOL) 325 MG tablet Take 1,300 mg by mouth every 4 (four) hours as needed (for pain.).     [provider]  amoxicillin (AMOXIL) 500 MG capsule Take 2 capsules (1,000 mg total) by mouth 2 (two) times daily. 03/11/17   Pisciotta, Charna Elizabeth    Family History History reviewed. No pertinent family history.  Social History Social History   Tobacco Use  . Smoking status: Former Smoker    Packs/day: 0.50    Types: Cigarettes  . Smokeless tobacco: Never Used  Substance Use Topics  . Alcohol use: Yes  . Drug use: No     Allergies   Patient has no known allergies.   Review of Systems Review of Systems  Constitutional: Positive for appetite change and fever.  HENT: Positive for sore throat and trouble swallowing. Negative for congestion and voice change.   Respiratory: Negative for cough and shortness of  breath.   Cardiovascular: Negative for chest pain.  Gastrointestinal: Negative for abdominal pain.  Genitourinary: Negative for flank pain.  Musculoskeletal: Positive for myalgias.  Skin: Negative for rash.  Neurological: Negative for weakness.  Hematological: Negative for adenopathy.     Physical Exam Updated Vital Signs BP (!) 150/106 (BP Location: Left Arm)   Pulse 87   Temp 99.2 F (37.3 C) (Oral)   Resp 18   Ht 6\' 3"  (1.905 m)   Wt 133.8 kg (295 lb)   SpO2 98%   BMI 36.87 kg/m   Physical Exam  Constitutional: He appears well-developed.  HENT:  Head: Normocephalic.  Mouth/Throat: Mucous membranes are normal.  Anterior cervical lymphadenopathy.  Posterior pharynx has bilateral tonsils swollen with some exudate.  Uvula midline.  No peritonsillar swelling.  Neck: Normal range of motion.  Cardiovascular: Normal rate.  Pulmonary/Chest: Breath sounds normal.  Abdominal: There is no tenderness.  Neurological: He is alert.  Skin: Skin is warm. Capillary refill takes less than 2 seconds. No rash noted.     ED Treatments / Results  Labs (all labs ordered are listed, but only abnormal results are displayed) Labs Reviewed - No data to display  EKG  EKG Interpretation None       Radiology No results found.  Procedures Procedures (including critical care time)  Medications Ordered in  ED Medications  penicillin g benzathine (BICILLIN LA) 1200000 UNIT/2ML injection 1.2 Million Units (not administered)  dexamethasone (DECADRON) 1 MG/ML solution 10 mg (not administered)     Initial Impression / Assessment and Plan / ED Course  I have reviewed the triage vital signs and the nursing notes.  Pertinent labs & imaging results that were available during my care of the patient were reviewed by me and considered in my medical decision making (see chart for details).     Patient with sore throat.  Has had strep exposure and will empirically treat.  Given oral steroids  and IM penicillin here.  States he has been able to keep liquids down.  Will discharge home.  Patient also counseled on his high blood pressure and need for follow-up.  Final Clinical Impressions(s) / ED Diagnoses   Final diagnoses:  Pharyngitis, unspecified etiology    ED Discharge Orders    None       Davonna Belling, MD 10/13/17 1025

## 2018-05-03 ENCOUNTER — Emergency Department (HOSPITAL_COMMUNITY)
Admission: EM | Admit: 2018-05-03 | Discharge: 2018-05-03 | Disposition: A | Discharge status: Home / Self Care | Payer: Self-pay | Attending: Emergency Medicine | Admitting: Emergency Medicine

## 2018-05-03 ENCOUNTER — Encounter (HOSPITAL_COMMUNITY): Payer: Self-pay | Admitting: Emergency Medicine

## 2018-05-03 DIAGNOSIS — I10 Essential (primary) hypertension: Secondary | ICD-10-CM | POA: Insufficient documentation

## 2018-05-03 DIAGNOSIS — J020 Streptococcal pharyngitis: Secondary | ICD-10-CM | POA: Insufficient documentation

## 2018-05-03 DIAGNOSIS — Z79899 Other long term (current) drug therapy: Secondary | ICD-10-CM | POA: Insufficient documentation

## 2018-05-03 DIAGNOSIS — Z87891 Personal history of nicotine dependence: Secondary | ICD-10-CM | POA: Insufficient documentation

## 2018-05-03 LAB — GROUP A STREP BY PCR: GROUP A STREP BY PCR: DETECTED — AB

## 2018-05-03 MED ORDER — PENICILLIN G BENZATHINE 1200000 UNIT/2ML IM SUSP
1.2000 10*6.[IU] | Freq: Once | INTRAMUSCULAR | Status: AC
  Administered 2018-05-03: 1.2 10*6.[IU] via INTRAMUSCULAR
  Filled 2018-05-03: qty 2

## 2018-05-03 NOTE — ED Provider Notes (Signed)
[facility] DEPT Provider Note   CSN: [id number] Arrival date & time: 05/03/18  1020     History   Chief Complaint Chief Complaint  Patient presents with  . Sore Throat    HPI Hunter Huynh is a 37 y.o. male.  Patient with history of frequent strep throat infections presents the emergency department with complaint of sore throat starting 2 days ago.  Patient reports pain with swallowing.  No fevers, ear pain, runny nose, chest pain or cough.  No difficulty breathing.  No treatments prior to arrival.      Past Medical History:  Diagnosis Date  . History of streptococcal sore throat   . Hypertension     There are no active problems to display for this patient.   History reviewed. No pertinent surgical history.      Home Medications    Prior to Admission medications   Medication Sig Start Date End Date Taking? Authorizing Provider  acetaminophen (TYLENOL) 325 MG tablet Take 1,300 mg by mouth every 4 (four) hours as needed (for pain.).     [provider]  amoxicillin (AMOXIL) 500 MG capsule Take 2 capsules (1,000 mg total) by mouth 2 (two) times daily. 03/11/17   Pisciotta, Elmyra Ricks, PA-C  HYDROcodone-acetaminophen (NORCO/VICODIN) 5-325 MG tablet Take 1-2 tablets by mouth every 4 (four) hours as needed. 10/13/17   Davonna Belling, MD    Family History No family history on file.  Social History Social History   Tobacco Use  . Smoking status: Former Smoker    Packs/day: 0.50    Types: Cigarettes  . Smokeless tobacco: Never Used  Substance Use Topics  . Alcohol use: Yes  . Drug use: No     Allergies   Patient has no known allergies.   Review of Systems Review of Systems  Constitutional: Negative for chills, fatigue and fever.  HENT: Positive for sore throat and trouble swallowing. Negative for congestion, ear pain, rhinorrhea and sinus pressure.   Eyes: Negative for redness.  Respiratory: Negative for cough and  wheezing.   Gastrointestinal: Negative for abdominal pain, diarrhea, nausea and vomiting.  Genitourinary: Negative for dysuria.  Musculoskeletal: Negative for myalgias and neck stiffness.  Skin: Negative for rash.  Neurological: Negative for headaches.  Hematological: Negative for adenopathy.     Physical Exam Updated Vital Signs BP (!) 153/108 Comment: Knows that he has high BP but cannot afford medication  Pulse 90   Temp 98.1 F (36.7 C) (Oral)   Resp 15   SpO2 98%   Physical Exam  Constitutional: He appears well-developed and well-nourished.  HENT:  Head: Normocephalic and atraumatic.  Right Ear: Tympanic membrane, external ear and ear canal normal.  Left Ear: Tympanic membrane, external ear and ear canal normal.  Nose: Nose normal. No mucosal edema or rhinorrhea.  Mouth/Throat: Uvula is midline and mucous membranes are normal. Mucous membranes are not dry. No trismus in the jaw. No uvula swelling. Posterior oropharyngeal erythema present. No oropharyngeal exudate, posterior oropharyngeal edema or tonsillar abscesses.  Eyes: Conjunctivae are normal. Right eye exhibits no discharge. Left eye exhibits no discharge.  Neck: Normal range of motion. Neck supple.  Cardiovascular: Normal rate, regular rhythm and normal heart sounds.  Pulmonary/Chest: Effort normal and breath sounds normal. No respiratory distress. He has no wheezes. He has no rales.  Abdominal: Soft. There is no tenderness.  Neurological: He is alert.  Skin: Skin is warm and dry.  Psychiatric: He has a normal mood and  affect.  Nursing note and vitals reviewed.    ED Treatments / Results  Labs (all labs ordered are listed, but only abnormal results are displayed) Labs Reviewed  GROUP A STREP BY PCR    EKG None  Radiology No results found.  Procedures Procedures (including critical care time)  Medications Ordered in ED Medications - No data to display   Initial Impression / Assessment and Plan /  ED Course  I have reviewed the triage vital signs and the nursing notes.  Pertinent labs & imaging results that were available during my care of the patient were reviewed by me and considered in my medical decision making (see chart for details).     Patient seen and examined. Work-up initiated. Strep pending.   Vital signs reviewed and are as follows: BP (!) 153/108 Comment: Knows that he has high BP but cannot afford medication  Pulse 90   Temp 98.1 F (36.7 C) (Oral)   Resp 15   SpO2 98%   11:51 AM Strep positive.  Patient counseled on signs and symptoms to return, supportive treatment.  IM Bicillin given.  Final Clinical Impressions(s) / ED Diagnoses   Final diagnoses:  Strep pharyngitis   Patient with sore throat, positive strep test, no signs of peritonsillar abscess.  Do not suspect the space neck infection.  Patient appears well, nontoxic.  Treatment as above.  ED Discharge Orders    None       Carlisle Cater, Hershal Coria 05/03/18 [place], MD 05/03/18 [phone]

## 2018-05-03 NOTE — Discharge Instructions (Signed)
Please read and follow all provided instructions.  Your diagnoses today include:  1. Strep pharyngitis     Tests performed today include:  Strep test: was POSITIVE for strep throat  Vital signs. See below for your results today.   Medications prescribed:  You were given a one-time shot of penicillin to treat your strep throat.   Take any medications prescribed only as directed.   Home care instructions:  Please read the educational materials provided and follow any instructions contained in this packet.  Follow-up instructions: Please follow-up with your primary care provider as needed for further evaluation of your symptoms.  Return instructions:   Please return to the Emergency Department if you experience worsening symptoms.   Return if you have worsening problems swallowing, your neck becomes swollen, you cannot swallow your saliva or your voice becomes muffled.   Return with high persistent fever, persistent vomiting, or if you have trouble breathing.   Please return if you have any other emergent concerns.  Additional Information:  Your vital signs today were: BP (!) 153/108 Comment: Knows that he has high BP but cannot afford medication   Pulse 90    Temp 98.1 F (36.7 C) (Oral)    Resp 15    SpO2 98%  If your blood pressure (BP) was elevated above 135/85 this visit, please have this repeated by your doctor within one month. --------------

## 2018-05-03 NOTE — ED Triage Notes (Signed)
Pt c/o sore throat when talking and swallowing since Monday.

## 2019-05-12 ENCOUNTER — Encounter (HOSPITAL_COMMUNITY): Payer: Self-pay

## 2019-05-12 ENCOUNTER — Emergency Department (HOSPITAL_COMMUNITY)
Admission: EM | Admit: 2019-05-12 | Discharge: 2019-05-12 | Disposition: A | Discharge status: Home / Self Care | Payer: Self-pay | Attending: Emergency Medicine | Admitting: Emergency Medicine

## 2019-05-12 ENCOUNTER — Other Ambulatory Visit: Payer: Self-pay

## 2019-05-12 DIAGNOSIS — Z79899 Other long term (current) drug therapy: Secondary | ICD-10-CM | POA: Insufficient documentation

## 2019-05-12 DIAGNOSIS — I10 Essential (primary) hypertension: Secondary | ICD-10-CM | POA: Insufficient documentation

## 2019-05-12 DIAGNOSIS — R55 Syncope and collapse: Secondary | ICD-10-CM

## 2019-05-12 DIAGNOSIS — E860 Dehydration: Secondary | ICD-10-CM

## 2019-05-12 LAB — BASIC METABOLIC PANEL
Anion gap: 13 (ref 5–15)
BUN: 27 mg/dL — ABNORMAL HIGH (ref 6–20)
CO2: 25 mmol/L (ref 22–32)
Calcium: 9.9 mg/dL (ref 8.9–10.3)
Chloride: 100 mmol/L (ref 98–111)
Creatinine, Ser: 2.54 mg/dL — ABNORMAL HIGH (ref 0.61–1.24)
GFR calc Af Amer: 36 mL/min — ABNORMAL LOW (ref >60)
GFR calc non Af Amer: 31 mL/min — ABNORMAL LOW (ref >60)
Glucose, Bld: 99 mg/dL (ref 70–99)
Potassium: 4.8 mmol/L (ref 3.5–5.1)
Sodium: 138 mmol/L (ref 135–145)

## 2019-05-12 LAB — URINALYSIS, ROUTINE W REFLEX MICROSCOPIC
Bilirubin Urine: NEGATIVE
Glucose, UA: NEGATIVE mg/dL
Hgb urine dipstick: NEGATIVE
Ketones, ur: NEGATIVE mg/dL
Leukocytes,Ua: NEGATIVE
Nitrite: NEGATIVE
Protein, ur: NEGATIVE mg/dL
Specific Gravity, Urine: 1.021 (ref 1.005–1.030)
pH: 5 (ref 5.0–8.0)

## 2019-05-12 LAB — CBC
HCT: 45.4 % (ref 39.0–52.0)
Hemoglobin: 14.7 g/dL (ref 13.0–17.0)
MCH: 29.2 pg (ref 26.0–34.0)
MCHC: 32.4 g/dL (ref 30.0–36.0)
MCV: 90.1 fL (ref 80.0–100.0)
Platelets: 198 10*3/uL (ref 150–400)
RBC: 5.04 MIL/uL (ref 4.22–5.81)
RDW: 13.9 % (ref 11.5–15.5)
WBC: 7.5 10*3/uL (ref 4.0–10.5)
nRBC: 0 % (ref 0.0–0.2)

## 2019-05-12 MED ORDER — SODIUM CHLORIDE 0.9 % IV BOLUS
1000.0000 mL | Freq: Once | INTRAVENOUS | Status: AC
  Administered 2019-05-12: 1000 mL via INTRAVENOUS

## 2019-05-12 MED ORDER — SODIUM CHLORIDE 0.9% FLUSH: 3.0000 mL | Freq: Once | INTRAVENOUS | Status: DC

## 2019-05-12 NOTE — Discharge Instructions (Signed)
It is important to get a primary care doctor for management of your high blood pressure as well as to have your kidney function rechecked as discussed. Return here as needed.
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
## 2019-05-12 NOTE — ED Provider Notes (Signed)
[facility] DEPT Provider Note   CSN: [id number] Arrival date & time: 05/12/19  [id number]     History   Chief Complaint Chief Complaint  Patient presents with  . Syncope    HPI Hunter Huynh is a 38 y.o. male.     Patient with a history of hypertension presents for evaluation of syncopal episode that occurred tonight while at home. 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 He became nauseous and got up to the bathroom when he passed out. He states his girlfriend was there with him so he knows the duration of the episode was brief. No injury. No chest pain, SOB, recent illness or fever. He states he does not feel he drinks enough water while working in a hot environment. He feels asymptomatic now.   The history is provided by the patient. No language interpreter was used.    Past Medical History:  Diagnosis Date  . History of streptococcal sore throat   . Hypertension     There are no active problems to display for this patient.   History reviewed. No pertinent surgical history.      Home Medications    Prior to Admission medications   Medication Sig Start Date End Date Taking? Authorizing Provider  acetaminophen (TYLENOL) 325 MG tablet Take 1,300 mg by mouth every 4 (four) hours as needed (for pain.).     [provider]    Family History History reviewed. No pertinent family history.  Social History Social History   Tobacco Use  . Smoking status: Former Smoker    Packs/day: 0.50    Types: Cigarettes  . Smokeless tobacco: Never Used  Substance Use Topics  . Alcohol use: Yes  . Drug use: No     Allergies   Patient has no known allergies.   Review of Systems Review of Systems  Constitutional: Negative for chills and fever.  HENT: Negative.   Respiratory: Negative.  Negative for shortness of breath.   Cardiovascular: Negative.  Negative for chest pain.   Gastrointestinal: Positive for nausea.  Musculoskeletal: Negative.   Skin: Negative.   Neurological: Positive for syncope and light-headedness.     Physical Exam Updated Vital Signs BP 125/74 (BP Location: Right Arm)   Pulse 69   Temp 98 F (36.7 C) (Oral)   Resp 12   SpO2 100%   Physical Exam Vitals signs and nursing note reviewed.  Constitutional:      Appearance: He is well-developed.  HENT:     Head: Normocephalic.  Neck:     Musculoskeletal: Normal range of motion and neck supple.     Vascular: No carotid bruit.  Cardiovascular:     Rate and Rhythm: Normal rate and regular rhythm.     Heart sounds: No murmur.  Pulmonary:     Effort: Pulmonary effort is normal.     Breath sounds: Normal breath sounds. No wheezing, rhonchi or rales.  Abdominal:     General: Bowel sounds are normal.     Palpations: Abdomen is soft.     Tenderness: There is no abdominal tenderness. There is no guarding or rebound.  Musculoskeletal: Normal range of motion.  Skin:    General: Skin is warm and dry.     Findings: No rash.  Neurological:     Mental Status: He is alert and oriented to person, place, and time.     Sensory: No sensory deficit.  Motor: No weakness.     Coordination: Coordination normal.     Gait: Gait normal.      ED Treatments / Results  Labs (all labs ordered are listed, but only abnormal results are displayed) Labs Reviewed  BASIC METABOLIC PANEL - Abnormal; Notable for the following components:      Result Value   BUN 27 (*)    Creatinine, Ser 2.54 (*)    GFR calc non Af Amer 31 (*)    GFR calc Af Amer 36 (*)    All other components within normal limits  URINALYSIS, ROUTINE W REFLEX MICROSCOPIC - Abnormal; Notable for the following components:   APPearance CLOUDY (*)    All other components within normal limits  CBC  CBG MONITORING, ED    EKG EKG Interpretation  Date/Time:  Saturday May 12 2019 04:01:51 EDT Ventricular Rate:  85 PR Interval:     QRS Duration: 69 QT Interval:  452 QTC Calculation: 538 R Axis:   8 Text Interpretation:  Sinus rhythm Low voltage, precordial leads Borderline T abnormalities, diffuse leads Prolonged QT interval Confirmed by Shanon Rosser [phone]) on 05/12/2019 4:17:32 AM   Radiology No results found.  Procedures Procedures (including critical care time)  Medications Ordered in ED Medications  sodium chloride flush (NS) 0.9 % injection 3 mL (has no administration in time range)  sodium chloride 0.9 % bolus 1,000 mL (1,000 mLs Intravenous New Bag/Given 05/12/19 0541)     Initial Impression / Assessment and Plan / ED Course  I have reviewed the triage vital signs and the nursing notes.  Pertinent labs & imaging results that were available during my care of the patient were reviewed by me and considered in my medical decision making (see chart for details).        Patient to ED for evaluation of syncopal episode tonight while at home. No CP, SOB. He became lightheaded, nauseous, got up from sitting and passed out.   He has an elevated creatinine of 2.54, BUN 27. Last comparable was 3 years ago when renal functions were normal. Feel syncope, Cr elevation are related more to dehydration than renal failure. He has been given a liter of fluids and feels much better. No further symptoms of nausea or lightheadedness. He ambulates to the bathroom without symptoms. Orthostatic VS normal. He is drinking fluids in the ED.   Will provide referrals to primary care providers and stressed the importance of recheck of his renal functions as well as continue treatment of his HTN.     Final Clinical Impressions(s) / ED Diagnoses   Final diagnoses:  None   1. Vasovagal syncope 2. Dehydration  ED Discharge Orders    None       Charlann Lange, PA-C 05/12/19 0709    Shanon Rosser, MD 05/12/19 [phone]

## 2020-03-10 ENCOUNTER — Encounter (HOSPITAL_COMMUNITY): Payer: Self-pay | Admitting: *Deleted

## 2020-03-10 ENCOUNTER — Other Ambulatory Visit: Payer: Self-pay

## 2020-03-10 ENCOUNTER — Emergency Department (HOSPITAL_COMMUNITY)
Admission: EM | Admit: 2020-03-10 | Discharge: 2020-03-10 | Disposition: A | Discharge status: Home / Self Care | Payer: Self-pay | Attending: Emergency Medicine | Admitting: Emergency Medicine

## 2020-03-10 ENCOUNTER — Emergency Department (HOSPITAL_COMMUNITY): Payer: Self-pay

## 2020-03-10 DIAGNOSIS — Z87891 Personal history of nicotine dependence: Secondary | ICD-10-CM | POA: Insufficient documentation

## 2020-03-10 DIAGNOSIS — M25561 Pain in right knee: Secondary | ICD-10-CM | POA: Insufficient documentation

## 2020-03-10 DIAGNOSIS — I10 Essential (primary) hypertension: Secondary | ICD-10-CM | POA: Insufficient documentation

## 2020-03-10 NOTE — ED Provider Notes (Signed)
[facility] DEPT Provider Note   CSN: [id number] Arrival date & time: 03/10/20  5537     History Chief Complaint  Patient presents with  . Knee Pain    R    Hunter Huynh is a 39 y.o. male.  HPI Patient is a 39 year old male presenting today with right knee pain.  He states this has been intermittent problem for many years.  He states that however over the past week he has had worsening right knee pain.  He states that approximately 1 week ago when he first noticed he was having right knee pain he went to the gym and did some squats and heavy lifting.  He states that it was significantly worse after that.  He states that he has been concerned that he might have some osteoarthritis although this is never been confirmed.  He played football for 8 years and states that he had some issues with his ACL although he denies any ACL tendon rupture.  He has never had surgeries on his knees.  He denies any fevers, nausea, vomiting, chills, redness or warmth of the knee.  He states that he has noticed some swelling.  He has been taking Tylenol every 8 hours with only mild relief.      Past Medical History:  Diagnosis Date  . History of streptococcal sore throat   . Hypertension     There are no problems to display for this patient.   History reviewed. No pertinent surgical history.     No family history on file.  Social History   Tobacco Use  . Smoking status: Former Smoker    Packs/day: 0.50    Types: Cigarettes  . Smokeless tobacco: Never Used  Vaping Use  . Vaping Use: Every day  Substance Use Topics  . Alcohol use: Yes  . Drug use: No    Home Medications Prior to Admission medications   Medication Sig Start Date End Date Taking? Authorizing Provider  acetaminophen (TYLENOL) 325 MG tablet Take 1,300 mg by mouth every 4 (four) hours as needed (for pain.).     [provider]    Allergies    Patient has no known  allergies.  Review of Systems   Review of Systems  Constitutional: Negative for chills and fever.  HENT: Negative for congestion.   Respiratory: Negative for shortness of breath.   Cardiovascular: Negative for chest pain.  Gastrointestinal: Negative for abdominal pain.  Musculoskeletal: Negative for neck pain.       Right knee pain    Physical Exam Updated Vital Signs BP (!) 153/110   Pulse 91   Temp 98.1 F (36.7 C) (Oral)   Resp 18   Ht 6\' 3"  (1.905 m)   Wt (!) 156.5 kg   SpO2 97%   BMI 43.12 kg/m   Physical Exam Vitals and nursing note reviewed.  Constitutional:      General: He is not in acute distress.    Appearance: Normal appearance. He is not ill-appearing.  HENT:     Head: Normocephalic and atraumatic.  Eyes:     General: No scleral icterus.       Right eye: No discharge.        Left eye: No discharge.     Conjunctiva/sclera: Conjunctivae normal.  Cardiovascular:     Rate and Rhythm: Normal rate.     Comments: Bilateral DP/PT pulses 3+ and symmetric. Pulmonary:     Effort: Pulmonary effort is normal.  Breath sounds: No stridor.  Musculoskeletal:     Comments: Full range of motion of knee passively bilaterally. Hesitancy with full extension of knee, 4/5 strength flexion.  This weakness is secondary to pain.  When he is held at 90 degrees he is able to push and pull with expected strength against examiner. Very mildly positive ballottement.  Skin:    General: Skin is warm and dry.     Capillary Refill: Capillary refill takes less than 2 seconds.  Neurological:     Mental Status: He is alert and oriented to person, place, and time. Mental status is at baseline.     ED Results / Procedures / Treatments   Labs (all labs ordered are listed, but only abnormal results are displayed) Labs Reviewed - No data to display  EKG None  Radiology DG Knee Complete 4 Views Right  Result Date: 03/10/2020 CLINICAL DATA:  Worsening right knee pain over the  past few days. No known injury. EXAM: RIGHT KNEE - COMPLETE 4+ VIEW COMPARISON:  None. FINDINGS: No acute bony abnormality is identified. Moderate joint effusion is present. Joint spaces are preserved. No focal bony lesion. IMPRESSION: Moderate joint effusion.  Otherwise negative. Electronically Signed   By: Inge Rise M.D.   On: 03/10/2020 08:43    Procedures Procedures (including critical care time)  Medications Ordered in ED Medications - No data to display  ED Course  I have reviewed the triage vital signs and the nursing notes.  Pertinent labs & imaging results that were available during my care of the patient were reviewed by me and considered in my medical decision making (see chart for details).    MDM Rules/Calculators/A&P                          Patient 39 year old male with past medical history of knee problems with no history of osteoarthritis with mild swelling of the knee, knee pain for possible days.  He has a history of similar flareups and issues.  He did play difficult for many years.  He denies any trauma today however will obtain x-rays to evaluate for any evidence of osteoarthritis.  Suspect this is likely overuse injury/arthritic effusion.  There is no indication that this is septic arthritis as patient has good range of motion passively and does have strength although he does have pain with using it.  He is ambulatory we will provide patient with compression sleeve and crutches as he states it is very painful to walk.  Will provide patient with orthopedic doctor follow-up.  Patient return precautions.  I specifically doubt DVT, arterial injury or other acute abnormality today.   Final Clinical Impression(s) / ED Diagnoses Final diagnoses:  Right knee pain, unspecified chronicity    Rx / DC Orders ED Discharge Orders    None       Tedd Sias, [place] 03/10/20 1528    Lucrezia Starch, MD 03/12/20 1029

## 2020-03-10 NOTE — ED Notes (Signed)
Patient left prior to doing his signature for discharge, but did have discharge papers. Discharge instructins completed prior to ortho tech coming down for the knee brace and crutches.

## 2020-03-10 NOTE — ED Triage Notes (Signed)
Rt knee pain for over a week, hurt knee years ago, can not recall any recent injury.

## 2020-03-10 NOTE — Progress Notes (Signed)
Orthopedic Tech Progress Note Patient Details:  Hunter Huynh 10-13-1980 [id number]  Ortho Devices Type of Ortho Device: Crutches, Knee Sleeve Ortho Device/Splint Location: right Ortho Device/Splint Interventions: Application   Post Interventions Patient Tolerated: Well Instructions Provided: Care of device   [name] 03/10/2020, 10:05 AM

## 2020-03-10 NOTE — Discharge Instructions (Addendum)
Your x-ray was negative for any fracture or acute abnormality today.  Your symptoms are consistent with chronic knee pain.  Recommend following up with an orthopedic doctor.  Please use a knee sleeve and crutches as needed.  You may rest ice and elevate your knee.  Please use Tylenol or ibuprofen for pain.  You may use 600 mg ibuprofen every 6 hours or 1000 mg of Tylenol every 6 hours.  You may choose to alternate between the 2.  This would be most effective.  Not to exceed 4 g of Tylenol within 24 hours.  Not to exceed 3200 mg ibuprofen 24 hours.

## 2021-07-23 ENCOUNTER — Encounter (HOSPITAL_COMMUNITY): Payer: Self-pay

## 2021-07-23 ENCOUNTER — Emergency Department (HOSPITAL_COMMUNITY)
Admission: EM | Admit: 2021-07-23 | Discharge: 2021-07-23 | Disposition: A | Discharge status: Home / Self Care | Payer: Self-pay | Attending: Emergency Medicine | Admitting: Emergency Medicine

## 2021-07-23 DIAGNOSIS — Z87891 Personal history of nicotine dependence: Secondary | ICD-10-CM | POA: Insufficient documentation

## 2021-07-23 DIAGNOSIS — I10 Essential (primary) hypertension: Secondary | ICD-10-CM | POA: Insufficient documentation

## 2021-07-23 DIAGNOSIS — M5431 Sciatica, right side: Secondary | ICD-10-CM | POA: Insufficient documentation

## 2021-07-23 MED ORDER — PREDNISONE 20 MG PO TABS: 40.0000 mg | ORAL_TABLET | Freq: Every day | ORAL | 0 refills | Status: AC

## 2021-07-23 MED ORDER — DEXAMETHASONE SODIUM PHOSPHATE 10 MG/ML IJ SOLN
10.0000 mg | Freq: Once | INTRAMUSCULAR | Status: AC
  Administered 2021-07-23: 10 mg via INTRAMUSCULAR
  Filled 2021-07-23: qty 1

## 2021-07-23 MED ORDER — KETOROLAC TROMETHAMINE 30 MG/ML IJ SOLN
30.0000 mg | Freq: Once | INTRAMUSCULAR | Status: AC
  Administered 2021-07-23: 30 mg via INTRAMUSCULAR
  Filled 2021-07-23: qty 1
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
## 2021-07-23 NOTE — Discharge Instructions (Signed)
You were seen in the emergency department today for right leg pain.  It is likely that you have sciatica.  I have given you pain medication and a steroid injection here in the emergency department to help ease your symptoms.  I also prescribed you prednisone for the next 4 days after today.  Please start taking this tomorrow 07/24/2021.  You may also use Tylenol or ibuprofen as needed for pain relief.  As we discussed at the bedside it may be helpful to use heat over the area and then do some gentle stretching.  Please return to the emergency department if you begin having weakness in your lower extremities, fever, numbness or tingling in your groin, inability to control your bowel or bladder.

## 2021-07-23 NOTE — ED Provider Notes (Signed)
[facility] DEPT Provider Note   CSN: [id number] Arrival date & time: 07/23/21  3875     History Chief Complaint  Patient presents with   Leg Pain    Hunter Huynh is a 40 y.o. male.  With past medical history of hypertension who presents emergency department with right leg pain.  States he has had 1 week of sharp, intermittent, stinging pain down his right leg into his right foot.  He states that it is worse with walking and standing up.  He said initially that sitting improved his pain but is now become painful as well.  He has tried diclofenac without relief of symptoms.  He denies any fever, numbness or tingling in the leg, trauma or falls.  Denies back pain.  Denies urinary symptoms or penile or testicular symptoms.   Leg Pain Associated symptoms: no back pain and no fever       Past Medical History:  Diagnosis Date   History of streptococcal sore throat    Hypertension     There are no problems to display for this patient.   History reviewed. No pertinent surgical history.     History reviewed. No pertinent family history.  Social History   Tobacco Use   Smoking status: Former    Packs/day: 0.50    Types: Cigarettes   Smokeless tobacco: Never  Vaping Use   Vaping Use: Every day  Substance Use Topics   Alcohol use: Yes   Drug use: No    Home Medications Prior to Admission medications   Medication Sig Start Date End Date Taking? Authorizing Provider  acetaminophen (TYLENOL) 325 MG tablet Take 1,300 mg by mouth every 4 (four) hours as needed (for pain.).     [provider]    Allergies    Patient has no known allergies.  Review of Systems   Review of Systems  Constitutional:  Negative for fever.  Musculoskeletal:  Positive for myalgias. Negative for back pain.  Neurological:  Negative for weakness and numbness.  All other systems reviewed and are negative.  Physical Exam Updated Vital Signs BP (!)  175/139 (BP Location: Left Arm) Comment: informed the nurse pt hasnt had medication  Pulse 94   Temp (!) 97.1 F (36.2 C) (Oral)   Resp 20   SpO2 99%   Physical Exam Vitals and nursing note reviewed.  Constitutional:      General: He is not in acute distress.    Appearance: Normal appearance. He is not ill-appearing or toxic-appearing.  HENT:     Head: Normocephalic and atraumatic.  Eyes:     General: No scleral icterus. Pulmonary:     Effort: Pulmonary effort is normal. No respiratory distress.  Musculoskeletal:        General: Tenderness present. No swelling, deformity or signs of injury.     Cervical back: Normal range of motion and neck supple.     Lumbar back: Positive right straight leg raise test. Negative left straight leg raise test.     Right hip: Normal strength.     Left hip: Normal strength.     Right upper leg: Tenderness present.     Left upper leg: No tenderness.     Right lower leg: No edema.     Left lower leg: No edema.  Skin:    General: Skin is warm and dry.     Capillary Refill: Capillary refill takes less than 2 seconds.     Findings: No  rash.  Neurological:     General: No focal deficit present.     Mental Status: He is alert and oriented to person, place, and time.     Motor: No weakness.     Gait: Gait normal.  Psychiatric:        Mood and Affect: Mood normal.        Behavior: Behavior normal.        Thought Content: Thought content normal.        Judgment: Judgment normal.    ED Results / Procedures / Treatments   Labs (all labs ordered are listed, but only abnormal results are displayed) Labs Reviewed - No data to display  EKG None  Radiology No results found.  Procedures Procedures   Medications Ordered in ED Medications  ketorolac (TORADOL) 30 MG/ML injection 30 mg (has no administration in time range)  dexamethasone (DECADRON) injection 10 mg (has no administration in time range)    ED Course  I have reviewed the triage  vital signs and the nursing notes.  Pertinent labs & imaging results that were available during my care of the patient were reviewed by me and considered in my medical decision making (see chart for details).    MDM Rules/Calculators/A&P 40 year old male who presents emergency department with nontraumatic right leg pain.  No trauma or falls.  There is no deformity on exam.  Joints are without erythema, warmth or swelling.  Doubt septic joint as source of pain.  He has no history of back injury, incontinence or retention of bowel or bladder, saddle anesthesia so doubt cauda equina.  There is no weakness in the bilateral lower extremities.  His gait is normal. Positive straight leg raise on the right side. Right leg pain most consistent with sciatica.  Given Toradol and Decadron here in the emergency department for pain relief and anti-inflammatory. We will discharge him with 4 more days of prednisone.  He is instructed to use Tylenol and ibuprofen as needed for pain relief in the meantime.  Have also instructed him to use heat or ice and gentle stretching to reduce the swelling around the nerve.  He is agreeable to this plan. Safe for discharge. Final Clinical Impression(s) / ED Diagnoses Final diagnoses:  Sciatica of right side    Rx / DC Orders ED Discharge Orders          Ordered    predniSONE (DELTASONE) 20 MG tablet  Daily        07/23/21 1052             Mickie Hillier, PA-C 07/23/21 1052    Daleen Bo, MD 07/23/21 1824

## 2021-09-12 ENCOUNTER — Emergency Department (HOSPITAL_COMMUNITY)
Admission: EM | Admit: 2021-09-12 | Discharge: 2021-09-12 | Disposition: A | Discharge status: Home / Self Care | Payer: Self-pay | Attending: Emergency Medicine | Admitting: Emergency Medicine

## 2021-09-12 ENCOUNTER — Emergency Department (HOSPITAL_COMMUNITY): Payer: Self-pay

## 2021-09-12 ENCOUNTER — Encounter: Payer: Self-pay | Admitting: Neurosurgery

## 2021-09-12 ENCOUNTER — Encounter (HOSPITAL_COMMUNITY): Payer: Self-pay | Admitting: Emergency Medicine

## 2021-09-12 DIAGNOSIS — D496 Neoplasm of unspecified behavior of brain: Secondary | ICD-10-CM

## 2021-09-12 DIAGNOSIS — D332 Benign neoplasm of brain, unspecified: Secondary | ICD-10-CM | POA: Insufficient documentation

## 2021-09-12 DIAGNOSIS — Z87891 Personal history of nicotine dependence: Secondary | ICD-10-CM | POA: Insufficient documentation

## 2021-09-12 DIAGNOSIS — I10 Essential (primary) hypertension: Secondary | ICD-10-CM | POA: Insufficient documentation

## 2021-09-12 LAB — CBC WITH DIFFERENTIAL/PLATELET
Abs Immature Granulocytes: 0.04 10*3/uL (ref 0.00–0.07)
Basophils Absolute: 0.1 10*3/uL (ref 0.0–0.1)
Basophils Relative: 1 %
Eosinophils Absolute: 0 10*3/uL (ref 0.0–0.5)
Eosinophils Relative: 0 %
HCT: 42.3 % (ref 39.0–52.0)
Hemoglobin: 14.1 g/dL (ref 13.0–17.0)
Immature Granulocytes: 0 %
Lymphocytes Relative: 26 %
Lymphs Abs: 2.7 10*3/uL (ref 0.7–4.0)
MCH: 29.7 pg (ref 26.0–34.0)
MCHC: 33.3 g/dL (ref 30.0–36.0)
MCV: 89.1 fL (ref 80.0–100.0)
Monocytes Absolute: 0.9 10*3/uL (ref 0.1–1.0)
Monocytes Relative: 8 %
Neutro Abs: 6.9 10*3/uL (ref 1.7–7.7)
Neutrophils Relative %: 65 %
Platelets: 368 10*3/uL (ref 150–400)
RBC: 4.75 MIL/uL (ref 4.22–5.81)
RDW: 13.9 % (ref 11.5–15.5)
WBC: 10.6 10*3/uL — ABNORMAL HIGH (ref 4.0–10.5)
nRBC: 0 % (ref 0.0–0.2)

## 2021-09-12 LAB — COMPREHENSIVE METABOLIC PANEL
ALT: 46 U/L — ABNORMAL HIGH (ref 0–44)
AST: 24 U/L (ref 15–41)
Albumin: 4.4 g/dL (ref 3.5–5.0)
Alkaline Phosphatase: 71 U/L (ref 38–126)
Anion gap: 11 (ref 5–15)
BUN: 23 mg/dL — ABNORMAL HIGH (ref 6–20)
CO2: 22 mmol/L (ref 22–32)
Calcium: 9.1 mg/dL (ref 8.9–10.3)
Chloride: 100 mmol/L (ref 98–111)
Creatinine, Ser: 1.44 mg/dL — ABNORMAL HIGH (ref 0.61–1.24)
GFR, Estimated: >60 mL/min (ref >60)
Glucose, Bld: 164 mg/dL — ABNORMAL HIGH (ref 70–99)
Potassium: 3.2 mmol/L — ABNORMAL LOW (ref 3.5–5.1)
Sodium: 133 mmol/L — ABNORMAL LOW (ref 135–145)
Total Bilirubin: 1.1 mg/dL (ref 0.3–1.2)
Total Protein: 7.8 g/dL (ref 6.5–8.1)

## 2021-09-12 LAB — T4, FREE: Free T4: 0.89 ng/dL (ref 0.61–1.12)

## 2021-09-12 LAB — CORTISOL-AM, BLOOD: Cortisol - AM: 19 ug/dL (ref 6.7–22.6)

## 2021-09-12 LAB — TSH: TSH: 1.199 u[IU]/mL (ref 0.350–4.500)

## 2021-09-12 MED ORDER — OXYCODONE-ACETAMINOPHEN 5-325 MG PO TABS: 1.0000 | ORAL_TABLET | Freq: Four times a day (QID) | ORAL | 0 refills | Status: DC | PRN

## 2021-09-12 MED ORDER — GADOBUTROL 1 MMOL/ML IV SOLN
10.0000 mL | Freq: Once | INTRAVENOUS | Status: AC | PRN
  Administered 2021-09-12: 08:00:00 10 mL via INTRAVENOUS

## 2021-09-12 MED ORDER — SODIUM CHLORIDE 0.9 % IV BOLUS
1000.0000 mL | Freq: Once | INTRAVENOUS | Status: AC
  Administered 2021-09-12: 04:00:00 1000 mL via INTRAVENOUS

## 2021-09-12 MED ORDER — HYDROMORPHONE HCL 1 MG/ML IJ SOLN: 1.0000 mg | Freq: Once | INTRAMUSCULAR | Status: DC | PRN

## 2021-09-12 MED ORDER — DROPERIDOL 2.5 MG/ML IJ SOLN
1.2500 mg | Freq: Once | INTRAMUSCULAR | Status: AC
  Administered 2021-09-12: 04:00:00 1.25 mg via INTRAVENOUS
  Filled 2021-09-12: qty 2

## 2021-09-12 NOTE — Discharge Instructions (Signed)
Dr. Cleotilde Neer office will call you on Monday to schedule a follow-up visit

## 2021-09-12 NOTE — ED Provider Notes (Signed)
The patient signed to me by Dr. Ralene Bathe pending results of MRI.  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 Kathyrn Sheriff,.  He will see the patient on Monday to schedule further treatment   Lacretia Leigh, MD 09/12/21 [phone]

## 2021-09-12 NOTE — Progress Notes (Signed)
Endocrine labs reviewed. Specifically, am cortisol WNL and free T4 WNL. No urgent need for exogenous steroids or thyroid replacement. PRL pending. Will follow up on outpatient basis for possible surgical resection.   Consuella Lose, MD  [facility]

## 2021-09-12 NOTE — ED Triage Notes (Signed)
Pt reports a headache that started this evening at work. Denies any injury. He states that he thinks it is his blood pressure. He also started vomiting about 2 hours ago. Denies hx of migraines. He endorses photophobia and nausea.

## 2021-09-12 NOTE — ED Provider Notes (Signed)
[facility] DEPT Provider Note   CSN: [id number] Arrival date & time: 09/12/21  0110     History Chief Complaint  Patient presents with   Headache    Hunter Huynh is a 40 y.o. male.  The history is provided by the patient.  Headache Hunter Huynh is a 40 y.o. male who presents to the Emergency Department complaining of headache.  He presents to the emergency department complaining of 1 or 2 days of severe right-sided headache.  Pain is located in the right temple and the right occipital region.  He is not entirely sure when the headache began or what the onset was like.  He has associated nausea and vomiting.  He has photophobia.  No history of headaches.  No one in the household has headaches.  He has a history of hypertension but is not currently on meds.  Symptoms are severe and constant in nature.  Additional history available from patient's wife after his initial assessment.  She states that he started complaining of headache about 2 days ago.  Headache was gradual in onset and it significantly worsened today.    Past Medical History:  Diagnosis Date   History of streptococcal sore throat    Hypertension     There are no problems to display for this patient.   History reviewed. No pertinent surgical history.     History reviewed. No pertinent family history.  Social History   Tobacco Use   Smoking status: Former    Packs/day: 0.50    Types: Cigarettes   Smokeless tobacco: Never  Vaping Use   Vaping Use: Every day  Substance Use Topics   Alcohol use: Yes   Drug use: No    Home Medications Prior to Admission medications   Medication Sig Start Date End Date Taking? Authorizing Provider  ibuprofen (ADVIL) 200 MG tablet Take 600 mg by mouth every 8 (eight) hours as needed for moderate pain.   Yes [provider]    Allergies    Patient has no known allergies.  Review of Systems   Review of Systems   Neurological:  Positive for headaches.  All other systems reviewed and are negative.  Physical Exam Updated Vital Signs BP (!) 165/108 (BP Location: Left Arm)    Pulse 84    Temp 98.2 F (36.8 C) (Oral)    Resp 16    Ht 6\' 3"  (1.905 m)    Wt (!) 149.7 kg    SpO2 100%    BMI 41.25 kg/m   Physical Exam Vitals and nursing note reviewed.  Constitutional:      Appearance: He is well-developed.  HENT:     Head: Normocephalic and atraumatic.     Mouth/Throat:     Mouth: Mucous membranes are dry.  Cardiovascular:     Rate and Rhythm: Normal rate and regular rhythm.     Heart sounds: No murmur heard. Pulmonary:     Effort: Pulmonary effort is normal. No respiratory distress.     Breath sounds: Normal breath sounds.  Abdominal:     Palpations: Abdomen is soft.     Tenderness: There is no abdominal tenderness. There is no guarding or rebound.  Musculoskeletal:        General: No tenderness.  Skin:    General: Skin is warm and dry.  Neurological:     Mental Status: He is alert and oriented to person, place, and time.     Comments: Pupils equal  round and reactive.  EOMI.  No asymmetry of facial movements.  5 out of 5 strength in all 4 extremities.  Psychiatric:        Behavior: Behavior normal.    ED Results / Procedures / Treatments   Labs (all labs ordered are listed, but only abnormal results are displayed) Labs Reviewed  CBC WITH DIFFERENTIAL/PLATELET - Abnormal; Notable for the following components:      Result Value   WBC 10.6 (*)    All other components within normal limits  COMPREHENSIVE METABOLIC PANEL - Abnormal; Notable for the following components:   Sodium 133 (*)    Potassium 3.2 (*)    Glucose, Bld 164 (*)    BUN 23 (*)    Creatinine, Ser 1.44 (*)    ALT 46 (*)    All other components within normal limits    EKG None  Radiology CT Head Wo Contrast  Result Date: 09/12/2021 CLINICAL DATA:  Headache. EXAM: CT HEAD WITHOUT CONTRAST TECHNIQUE: Contiguous  axial images were obtained from the base of the skull through the vertex without intravenous contrast. COMPARISON:  None. FINDINGS: Brain: No acute intracranial hemorrhage or midline shift. There is a lobular mass in the suprasellar region measuring 2.9 x 2.0 x 4.3 cm and may be continuous with the pituitary gland. There is likely mass effect on the optic chiasm. No extra-axial fluid collection. Gray-white matter differentiation is within normal limits and there is no hydrocephalus. Vascular: No hyperdense vessel. Skull: No acute fracture.  There is widening of the pituitary fossa. Sinuses/Orbits: No acute finding. Other: None. 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 There is likely mass effect on the optic chiasm. MRI with contrast is recommended for further evaluation. Electronically Signed   By: Brett Fairy M.D.   On: 09/12/2021 05:02    Procedures Procedures   Medications Ordered in ED Medications  HYDROmorphone (DILAUDID) injection 1 mg (has no administration in time range)  droperidol (INAPSINE) 2.5 MG/ML injection 1.25 mg (1.25 mg Intravenous Given 09/12/21 0414)  sodium chloride 0.9 % bolus 1,000 mL (0 mLs Intravenous Stopped 09/12/21 9373)    ED Course  I have reviewed the triage vital signs and the nursing notes.  Pertinent labs & imaging results that were available during my care of the patient were reviewed by me and considered in my medical decision making (see chart for details).    MDM Rules/Calculators/A&P                         Patient here for evaluation of headache.  He is neurologically intact on evaluation.  After treatment with medications in the department his headache has significantly improved.  CT scan was obtained given new onset headache.  CT scan is concerning for intracranial mass.  Discussed with patient  findings of study.  Plan to obtain MRI to further evaluate this lesion.  Patient care transferred pending MRI.    Final Clinical Impression(s) / ED Diagnoses Final diagnoses:  None    Rx / DC Orders ED Discharge Orders     None        Quintella Reichert, MD 09/12/21 [phone]

## 2021-09-14 LAB — PROLACTIN: Prolactin: 18.5 ng/mL — ABNORMAL HIGH (ref 4.0–15.2)

## 2021-09-14 LAB — ACTH: C206 ACTH: 31.1 pg/mL (ref 7.2–63.3)

## 2021-09-14 LAB — TESTOSTERONE: Testosterone: 77 ng/dL — ABNORMAL LOW (ref 264–916)

## 2021-09-15 LAB — INSULIN-LIKE GROWTH FACTOR: Somatomedin C: 117 ng/mL (ref 90–278)

## 2022-01-15 DIAGNOSIS — I10 Essential (primary) hypertension: Secondary | ICD-10-CM | POA: Insufficient documentation

## 2022-02-02 LAB — TSH: TSH: 0.9 (ref <=5.90)

## 2022-02-13 DIAGNOSIS — E232 Diabetes insipidus: Secondary | ICD-10-CM | POA: Insufficient documentation

## 2022-02-18 DIAGNOSIS — Z9889 Other specified postprocedural states: Secondary | ICD-10-CM

## 2022-02-18 HISTORY — DX: Other specified postprocedural states: Z98.890

## 2022-02-18 HISTORY — PX: OTHER SURGICAL HISTORY: SHX169

## 2022-02-23 LAB — TSH: TSH: 0.9 (ref 0.41–5.90)

## 2022-02-23 LAB — BASIC METABOLIC PANEL: Sodium: 141 (ref 137–147)

## 2022-02-24 LAB — BASIC METABOLIC PANEL
BUN: 8 (ref 4–21)
Chloride: 104 (ref 99–108)
Creatinine: 1 (ref 0.6–1.3)
Glucose: 103
Potassium: 4 mEq/L (ref 3.5–5.1)
Sodium: 139 (ref 137–147)

## 2022-02-24 LAB — HEPATIC FUNCTION PANEL
ALT: 40 U/L (ref 10–40)
AST: 18 (ref 14–40)
Alkaline Phosphatase: 81 (ref 25–125)
Bilirubin, Total: 0.4

## 2022-02-24 LAB — COMPREHENSIVE METABOLIC PANEL: Calcium: 9.2 (ref 8.7–10.7)

## 2022-03-05 LAB — BASIC METABOLIC PANEL: Sodium: 146 (ref 137–147)

## 2022-03-30 ENCOUNTER — Emergency Department (HOSPITAL_COMMUNITY)
Admission: EM | Admit: 2022-03-30 | Discharge: 2022-03-30 | Disposition: A | Discharge status: Home / Self Care | Payer: 59 | Attending: Emergency Medicine | Admitting: Emergency Medicine

## 2022-03-30 ENCOUNTER — Other Ambulatory Visit: Payer: Self-pay

## 2022-03-30 ENCOUNTER — Encounter (HOSPITAL_COMMUNITY): Payer: Self-pay

## 2022-03-30 DIAGNOSIS — Z760 Encounter for issue of repeat prescription: Secondary | ICD-10-CM | POA: Insufficient documentation

## 2022-03-30 MED ORDER — APIXABAN 5 MG PO TABS: 5.0000 mg | ORAL_TABLET | Freq: Two times a day (BID) | ORAL | 1 refills | Status: DC

## 2022-03-30 NOTE — ED Triage Notes (Signed)
Patient states that he had surgery at [facility] and had DVT post surgery on 02/24/22. Patient states that his PCp would not make an appointment to have it refilled.  Patient has been out of the Eliquis since yesterday.

## 2022-03-30 NOTE — Discharge Instructions (Signed)
I have refilled your prescription for your Eliquis.  Is extremely important that you take this medication as prescribed to prevent blood clots.  I have given you a prescription for 2 months of this medication, at some point during this time you will need to follow-up with your primary care doctor to discuss further need for this medication.  Return if development of any new or worsening symptoms.

## 2022-03-30 NOTE — ED Notes (Signed)
Pt stated he had run out of his eliquis and he needed an RX refill. He stated he called the hospital where he had his brain surgery and they told him to call his PCP. He stated he did this and that "for some reason, he would not see me anymore". He stated he ran out of his eliquis yesterday, and he was supposed to take one this am and this afternoon. He is worried about recurrent blood clots and is requesting a refill today.

## 2022-03-30 NOTE — ED Provider Notes (Signed)
[facility] DEPT Provider Note   CSN: [id number] Arrival date & time: 03/30/22  1745     History  No chief complaint on file.   Hunter Huynh is a 41 y.o. male.  Patient with history of DVT presents today requesting medication refill.  Patient states that yesterday evening he ran out of his Eliquis yesterday evening and has been on unable to get a refill yet.  States that he is supposed to be on Eliquis for 3 months and has only been on it for 1.  Denies any missed doses.  Denies any fevers, chills, chest pain, shortness of breath.  Patient denies any other concerns or complaints at this time.  The history is provided by the patient. No language interpreter was used.       Home Medications Prior to Admission medications   Medication Sig Start Date End Date Taking? Authorizing Provider  ibuprofen (ADVIL) 200 MG tablet Take 600 mg by mouth every 8 (eight) hours as needed for moderate pain.    [provider]  oxyCODONE-acetaminophen (PERCOCET/ROXICET) 5-325 MG tablet Take 1-2 tablets by mouth every 6 (six) hours as needed for severe pain. 09/12/21   Lacretia Leigh, MD      Allergies    Patient has no known allergies.    Review of Systems   Review of Systems  All other systems reviewed and are negative.   Physical Exam Updated Vital Signs BP (!) 145/111 (BP Location: Left Arm)   Pulse (!) 111   Temp 98 F (36.7 C) (Oral)   Resp 16   Ht '6\' 3"'$  (1.905 m)   Wt (!) 142 kg   SpO2 95%   BMI 39.12 kg/m  Physical Exam Vitals and nursing note reviewed.  Constitutional:      General: He is not in acute distress.    Appearance: Normal appearance. He is normal weight. He is not ill-appearing, toxic-appearing or diaphoretic.  HENT:     Head: Normocephalic and atraumatic.  Cardiovascular:     Rate and Rhythm: Normal rate.  Pulmonary:     Effort: Pulmonary effort is normal. No respiratory distress.  Musculoskeletal:        General:  Normal range of motion.     Cervical back: Normal range of motion.  Skin:    General: Skin is warm and dry.  Neurological:     General: No focal deficit present.     Mental Status: He is alert.  Psychiatric:        Mood and Affect: Mood normal.        Behavior: Behavior normal.     ED Results / Procedures / Treatments   Labs (all labs ordered are listed, but only abnormal results are displayed) Labs Reviewed - No data to display  EKG None  Radiology No results found.  Procedures Procedures    Medications Ordered in ED Medications - No data to display  ED Course/ Medical Decision Making/ A&P                           Medical Decision Making  Patient presents today requesting Eliquis refill.  He took his last dose last night.  He is afebrile, nontoxic-appearing, and in no acute distress with reassuring vital signs.  He denies any medical complaints at this time.  Patient has the bottle of medication with his that shows '5mg'$  Elliquis BID. Medication refill placed for same.  No further  emergent concerns at this time.  Patient understanding and amenable with plan, educated on red flag symptoms that would prompt immediate return.  Discharged in stable condition.   Final Clinical Impression(s) / ED Diagnoses Final diagnoses:  Encounter for medication refill    Rx / DC Orders ED Discharge Orders          Ordered    apixaban (ELIQUIS) 5 MG TABS tablet  2 times daily        03/30/22 1851          An After Visit Summary was printed and given to the patient.     Nestor Lewandowsky 03/30/22 Sherrilee Gilles, MD 04/06/22 [phone]

## 2022-03-30 NOTE — ED Notes (Signed)
I provided reinforced discharge education based off of discharge instructions. Pt acknowledged and understood my education. Pt had no further questions/concerns for provider/myself.  °

## 2022-04-02 DIAGNOSIS — E2740 Unspecified adrenocortical insufficiency: Secondary | ICD-10-CM | POA: Insufficient documentation

## 2022-04-02 DIAGNOSIS — E230 Hypopituitarism: Secondary | ICD-10-CM | POA: Insufficient documentation

## 2022-04-02 DIAGNOSIS — E038 Other specified hypothyroidism: Secondary | ICD-10-CM | POA: Insufficient documentation

## 2022-04-05 ENCOUNTER — Ambulatory Visit (INDEPENDENT_AMBULATORY_CARE_PROVIDER_SITE_OTHER): Payer: 59 | Admitting: Family Medicine

## 2022-04-05 ENCOUNTER — Encounter: Payer: Self-pay | Admitting: Family Medicine

## 2022-04-05 VITALS — BP 140/84 | HR 99 | Temp 97.4°F | Ht 73.0 in | Wt 308.2 lb

## 2022-04-05 DIAGNOSIS — D496 Neoplasm of unspecified behavior of brain: Secondary | ICD-10-CM | POA: Diagnosis not present

## 2022-04-05 DIAGNOSIS — E038 Other specified hypothyroidism: Secondary | ICD-10-CM

## 2022-04-05 DIAGNOSIS — I2699 Other pulmonary embolism without acute cor pulmonale: Secondary | ICD-10-CM | POA: Diagnosis not present

## 2022-04-05 DIAGNOSIS — I10 Essential (primary) hypertension: Secondary | ICD-10-CM

## 2022-04-05 DIAGNOSIS — E230 Hypopituitarism: Secondary | ICD-10-CM

## 2022-04-05 DIAGNOSIS — E2740 Unspecified adrenocortical insufficiency: Secondary | ICD-10-CM

## 2022-04-05 DIAGNOSIS — E232 Diabetes insipidus: Secondary | ICD-10-CM

## 2022-04-05 MED ORDER — AMLODIPINE-OLMESARTAN 5-20 MG PO TABS: 1.0000 | ORAL_TABLET | Freq: Every day | ORAL | 2 refills | Status: DC

## 2022-04-05 NOTE — Progress Notes (Signed)
Assessment/Plan:   Problem List Items Addressed This Visit       Cardiovascular and Mediastinum   Acute pulmonary embolism without acute cor pulmonale (HCC) - Primary    Recent PE and DVT status post surgery On apixaban started on 02/2022 We will need treatment for 3 to 6 months, leaning towards 6 months given other risk factors including obesity Patient states that he currently has enough medication to make it for 2 more months and declines refills moment Patient can refill request from pharmacy Patient follow-up in 3 months to reevaluate      Relevant Medications   amLODipine-olmesartan (AZOR) 5-20 MG tablet   Primary hypertension    Borderline elevated Patient needs refill, this was placed Patient follow-up in 3 months for recheck Recent CMP normal renal function      Relevant Medications   amLODipine-olmesartan (AZOR) 5-20 MG tablet     Endocrine   Adrenal insufficiency (HCC)   Relevant Orders   Ambulatory referral to Endocrinology   Central hypothyroidism   Relevant Medications   levothyroxine (SYNTHROID) 150 MCG tablet   Panhypopituitarism ([place])   Relevant Orders   Ambulatory referral to Endocrinology   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 male who has Acute pulmonary embolism without acute cor pulmonale ([place]); Adrenal insufficiency ([place]); Central hypothyroidism; Panhypopituitarism ([place]); Diabetes insipidus ([place]); Primary hypertension; and Brain tumor [facility]) on their problem list..   He  has a past medical history of History of brain surgery (02/2022), History of streptococcal sore throat, and Hypertension.Marland Kitchen    He presents with chief complaint of Establish Care (Patient was put on eliquis after brain surgery 2 weeks ago and needs a refill. Would like to discuss b/p medication refill) .    Patient here to establish care.  Patient has history of recent pituitary adenoma.  Status post resection 02/2022.  Patient now has panhypopituitarism with adrenal insufficiency, diabetes insipidus, and hypothyroidism.  Patient with ongoing fatigue,.  He is currently being worked up by endocrinology at [facility].  He is looking to reestablish with endocrinology in the area.  He is now on DDAVP, Cortef, Synthroid.  Last labs drawn 02/2022, last CMP normal creatinine electrolytes, CBC with mild leukocytosis at 11 and mild anemia also 11, patient with random sodium 146.  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  Patient also had DVT and PE status post surgery.  He was started on apixaban.  Patient also with history of primary prevention currently amlodipine/olmesartan.  Denies having headaches, chest pain, shortness of breath, lower extremity swelling.   Past Surgical History:  Procedure Laterality Date   brain tumor removed  02/2022    Outpatient Medications Prior to Visit  Medication Sig Dispense Refill   apixaban (ELIQUIS) 5 MG TABS tablet Take 1 tablet (5 mg total) by mouth 2 (two) times daily. 60 tablet 1   desmopressin (DDAVP NASAL) 0.01 % solution Place into the nose.     hydrocortisone (CORTEF) 20 MG tablet Take 20 mg by mouth daily.     levothyroxine (SYNTHROID) 150 MCG tablet Take 150 mcg  by mouth every morning.     omeprazole (PRILOSEC) 20 MG capsule Take 1 capsule by mouth daily.     amLODipine-olmesartan (AZOR) 5-20 MG tablet Take 1 tablet by mouth daily.     oxyCODONE-acetaminophen (PERCOCET/ROXICET) 5-325 MG tablet Take 1-2 tablets by mouth every 6 (six) hours as needed for severe pain. 15 tablet 0   No facility-administered medications prior to visit.     History reviewed. No pertinent family history.  Social History   Socioeconomic History   Marital status: Married    Spouse name: Not on file   Number of children: Not on file   Years of education: Not on file   Highest education level: Not on file  Occupational History   Not on file  Tobacco Use   Smoking status: Former    Packs/day: 0.50    Types: Cigarettes   Smokeless tobacco: Never  Vaping Use   Vaping Use: Former  Substance and Sexual Activity   Alcohol use: Not Currently   Drug use: Yes    Types: Marijuana   Sexual activity: Not on file  Other Topics Concern   Not on file  Social History Narrative   Not on file   Social Determinants of Health   Financial Resource Strain: Not on file  Food Insecurity: Not on file  Transportation Needs: Not on file  Physical Activity: Not on file  Stress: Not on file  Social Connections: Not on file  Intimate Partner Violence: Not on file                                                                                                 Objective:  Physical Exam: BP 140/84 (BP Location: Right Arm, Patient Position: Sitting, Cuff Size: Large)   Pulse 99   Temp (!) 97.4 F (36.3 C) (Temporal)   Ht '6\' 1"'$  (1.854 m)   Wt (!) 308 lb 3.2 oz (139.8 kg)   SpO2 99%   BMI 40.66 kg/m    General: No acute distress. Awake and conversant.  Eyes: Normal conjunctiva, anicteric. Round symmetric pupils.  ENT: Hearing grossly intact. No nasal discharge.  Neck: Neck is supple. No masses or thyromegaly.  Respiratory: Respirations are non-labored. No auditory wheezing.  Skin: Warm. No rashes or ulcers.  Psych: Alert and oriented. Cooperative, Appropriate mood and affect, Normal judgment.  CV: No cyanosis or JVD MSK: Normal ambulation. No clubbing  Neuro: Sensation and CN II-XII grossly normal.        Alesia Banda, MD, MS

## 2022-04-05 NOTE — Assessment & Plan Note (Signed)
Recent PE and DVT status post surgery On apixaban started on 02/2022 We will need treatment for 3 to 6 months, leaning towards 6 months given other risk factors including obesity Patient states that he currently has enough medication to make it for 2 more months and declines refills moment Patient can refill request from pharmacy Patient follow-up in 3 months to reevaluate

## 2022-04-05 NOTE — Assessment & Plan Note (Signed)
Borderline elevated Patient needs refill, this was placed Patient follow-up in 3 months for recheck Recent CMP normal renal function

## 2022-04-05 NOTE — Assessment & Plan Note (Signed)
Pituitary adenoma with pan hypopituitarism Currently following with surgery and endocrinology at [facility] Currently on DDAVP, hydrocortisone, Synthroid We will refer to endocrinology at [facility]

## 2022-04-05 NOTE — Patient Instructions (Addendum)
We are referring you to [facility]  We are refilling your blood pressure medication

## 2022-04-27 ENCOUNTER — Telehealth: Payer: Self-pay | Admitting: Family Medicine

## 2022-04-27 NOTE — Telephone Encounter (Signed)
Caller Name: Jag Lenz Call back phone #: [phone]  MEDICATION(S): Eliquis, Azor  Has the patient contacted their pharmacy (YES/NO)? yes What did pharmacy advise? They need permission to fill these meds  Preferred Pharmacy: [facility]  [address] [place], [place] [address]     ~~~Please advise patient/caregiver to allow 2-3 business days to process RX refills.

## 2022-04-27 NOTE — Telephone Encounter (Signed)
Spoke with patient and advised him that he has two refills remaining on Azor and one refill remaining on eliquis. I advised him that Azor refills are at [facility] ([place] [place] and eliquis is at [place], [place] . He verbalized understanding.

## 2022-05-21 MED ORDER — APIXABAN 5 MG PO TABS: 5.0000 mg | ORAL_TABLET | Freq: Two times a day (BID) | ORAL | 1 refills | Status: DC

## 2022-05-21 NOTE — Telephone Encounter (Signed)
Caller Name: Tasha Jindra Call back phone #: [phone]   MEDICATION(S):  apixaban (ELIQUIS) 5 MG TABS tablet  Days of Med Remaining: 8-10 days  Has the patient contacted their pharmacy (YES/NO)? Yes - said for pt to f/u with provider  Preferred Pharmacy:  [facility] [address] [place]), [place] Phone:  [phone]  Fax:  [phone]

## 2022-05-21 NOTE — Telephone Encounter (Signed)
Patient notified and verbalized understanding. 

## 2022-05-21 NOTE — Addendum Note (Signed)
Addended by: Stasia Cavalier R on: 05/21/2022 10:00 AM   Modules accepted: Orders

## 2022-05-21 NOTE — Telephone Encounter (Signed)
Chart supports rx. Last OV: 04/05/2022 Next OV: 07/12/2022

## 2022-05-26 ENCOUNTER — Other Ambulatory Visit: Payer: Self-pay | Admitting: Family Medicine

## 2022-05-26 ENCOUNTER — Telehealth: Payer: Self-pay | Admitting: Family Medicine

## 2022-05-26 DIAGNOSIS — Z596 Low income: Secondary | ICD-10-CM

## 2022-05-26 DIAGNOSIS — I2699 Other pulmonary embolism without acute cor pulmonale: Secondary | ICD-10-CM

## 2022-05-26 NOTE — Telephone Encounter (Signed)
Pt has stated that Eloquis is too high for him to pay. Can something else be prescribed? Please call to let him know.

## 2022-05-26 NOTE — Telephone Encounter (Signed)
Please advise, see below.  Any alternatives?

## 2022-05-26 NOTE — Telephone Encounter (Signed)
Advised patient of annotation below and he verbalized understanding.

## 2022-05-27 ENCOUNTER — Other Ambulatory Visit: Payer: Self-pay

## 2022-05-27 DIAGNOSIS — I824Y1 Acute embolism and thrombosis of unspecified deep veins of right proximal lower extremity: Secondary | ICD-10-CM

## 2022-05-27 NOTE — Progress Notes (Signed)
Office Note     CC: Right lower extremity DVT Requesting Provider:  Bonnita Hollow, MD  HPI: Hunter Huynh is a 41 y.o. (Feb 12, 1981) male who presents at the request of Bonnita Hollow, MD for  follow-up for right lower extremity DVT.  Patient underwent brain tumor resection in [facility] 3 months ago.  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  Postoperatively, he developed a right lower extremity DVT, and subsequent PE.  He did not require any intervention and was placed on Eliquis.   On exam today, he was doing well. A [place] native, he graduated from [employer].  He played football at [employer].  Currently the father of 2 children working at [employer]. He denies right lower extremity pain, heaviness, swelling.   Past Medical History:  Diagnosis Date   History of brain surgery 02/2022   History of streptococcal sore throat    Hypertension     Past Surgical History:  Procedure Laterality Date   brain tumor removed  02/2022    Social History   Socioeconomic History   Marital status: Married    Spouse name: Not on file   Number of children: Not on file   Years of education: Not on file   Highest education level: Not on file  Occupational History   Not on file  Tobacco Use   Smoking status: Former    Packs/day: 0.50    Types: Cigarettes   Smokeless tobacco: Never  Vaping Use   Vaping Use: Former  Substance and Sexual Activity   Alcohol use: Not Currently   Drug use: Yes    Types: Marijuana   Sexual activity: Not on file  Other Topics Concern   Not on file  Social History Narrative   Not on file   Social Determinants of Health   Financial Resource Strain: Not on file  Food Insecurity: Not on file  Transportation Needs: Not on file  Physical Activity: Not on file  Stress: Not on file  Social Connections: Not on file  Intimate Partner Violence: Not on file   No family history on  file.  Current Outpatient Medications  Medication Sig Dispense Refill   amLODipine-olmesartan (AZOR) 5-20 MG tablet Take 1 tablet by mouth daily. 30 tablet 2   desmopressin (DDAVP NASAL) 0.01 % solution Place into the nose.     hydrocortisone (CORTEF) 20 MG tablet Take 20 mg by mouth daily.     levothyroxine (SYNTHROID) 150 MCG tablet Take 150 mcg by mouth every morning.     omeprazole (PRILOSEC) 20 MG capsule Take 1 capsule by mouth daily.     No current facility-administered medications for this visit.    No Known Allergies   REVIEW OF SYSTEMS:   '[X]'$  denotes positive finding, '[ ]'$  denotes negative finding Cardiac  Comments:  Chest pain or chest pressure:    Shortness of breath upon exertion:    Short of breath when lying flat:    Irregular heart rhythm:        Vascular    Pain in calf, thigh, or hip brought on by ambulation:    Pain in feet at night that wakes you up from your sleep:     Blood clot in your veins:    Leg swelling:         Pulmonary    Oxygen at home:    Productive cough:     Wheezing:  Neurologic    Sudden weakness in arms or legs:     Sudden numbness in arms or legs:     Sudden onset of difficulty speaking or slurred speech:    Temporary loss of vision in one eye:     Problems with dizziness:         Gastrointestinal    Blood in stool:     Vomited blood:         Genitourinary    Burning when urinating:     Blood in urine:        Psychiatric    Major depression:         Hematologic    Bleeding problems:    Problems with blood clotting too easily:        Skin    Rashes or ulcers:        Constitutional    Fever or chills:      PHYSICAL EXAMINATION:  There were no vitals filed for this visit.  General:  WDWN in NAD; vital signs documented above Gait: Not observed HENT: WNL, normocephalic Pulmonary: normal non-labored breathing , without Rales, rhonchi,  wheezing Cardiac: regular HR Abdomen: soft, NT, no masses Skin:  without rashes Vascular Exam/Pulses:  Right Left  Radial 2+ (normal) 2+ (normal)                       Extremities: without ischemic changes, without Gangrene , without cellulitis; without open wounds;  Musculoskeletal: no muscle wasting or atrophy  Neurologic: A&O X 3;  No focal weakness or paresthesias are detected Psychiatric:  The pt has Normal affect.   Non-Invasive Vascular Imaging:    MRI 05/27/22     There are interval postsurgical changes from right pterional  craniotomy for debulking of the sellar and suprasellar  mass. There is associated dural thickening and enhancement over  the right frontal and temporal lobes.   There is interval decrease in size of residual enhancing tumor  status post recurrent debulking with a small amount of  ill-defined tumor primarily along the posterior aspect of the  sella, similar degree of left cavernous sinus invasion,  and a dominant residual suprasellar nodular component of  enhancing tumor measuring approximately 1.9 x 1.7 x 1.4 cm (TV  by AP by SI). Suprasellar nodule of residual tumor extends along  the floor the third ventricle contacting the bilateral  supraclinoid ICAs, ventral basilar tip, and bilateral A1 ACAs.  Suprasellar component of tumor also results in persistent  mass effect on the optic chiasm which is displaced superiorly by  the mass.     ASSESSMENT/PLAN:: 41 y.o. male presenting with prior history of right lower extremity DVT, subsequent PE following brain surgery for pituitary adenoma resection.  Bart's right lower extremity deep venous thrombosis is defined as provoked from his surgery.  He is not being treated for any malignancy.  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  He has completed 3 months of anticoagulation, and can stop at this time.  He is aware that should he have return of right lower extremity heaviness, swelling, that he should  immediately present to the ED for evaluation.  If a new DVT is appreciated, he would require lifelong anticoagulation.   Hunter John, MD [facility] [phone]

## 2022-05-28 ENCOUNTER — Ambulatory Visit (INDEPENDENT_AMBULATORY_CARE_PROVIDER_SITE_OTHER): Payer: Commercial Managed Care - HMO | Admitting: Vascular Surgery

## 2022-05-28 ENCOUNTER — Ambulatory Visit (HOSPITAL_COMMUNITY)
Admission: RE | Admit: 2022-05-28 | Discharge: 2022-05-28 | Disposition: A | Discharge status: Home / Self Care | Payer: Commercial Managed Care - HMO | Source: Ambulatory Visit | Attending: Vascular Surgery | Admitting: Vascular Surgery

## 2022-05-28 ENCOUNTER — Encounter: Payer: Self-pay | Admitting: Vascular Surgery

## 2022-05-28 VITALS — BP 116/80 | HR 89 | Temp 97.7°F | Resp 20 | Ht 73.0 in | Wt 310.8 lb

## 2022-05-28 DIAGNOSIS — I824Y1 Acute embolism and thrombosis of unspecified deep veins of right proximal lower extremity: Secondary | ICD-10-CM | POA: Insufficient documentation

## 2022-05-28 DIAGNOSIS — I82531 Chronic embolism and thrombosis of right popliteal vein: Secondary | ICD-10-CM

## 2022-07-12 ENCOUNTER — Ambulatory Visit (INDEPENDENT_AMBULATORY_CARE_PROVIDER_SITE_OTHER): Payer: Commercial Managed Care - HMO | Admitting: Family Medicine

## 2022-07-12 ENCOUNTER — Encounter: Payer: Self-pay | Admitting: Family Medicine

## 2022-07-12 VITALS — BP 136/84 | HR 78 | Temp 97.8°F | Wt 318.6 lb

## 2022-07-12 DIAGNOSIS — E232 Diabetes insipidus: Secondary | ICD-10-CM

## 2022-07-12 DIAGNOSIS — I10 Essential (primary) hypertension: Secondary | ICD-10-CM | POA: Diagnosis not present

## 2022-07-12 DIAGNOSIS — F17200 Nicotine dependence, unspecified, uncomplicated: Secondary | ICD-10-CM

## 2022-07-12 DIAGNOSIS — E2740 Unspecified adrenocortical insufficiency: Secondary | ICD-10-CM

## 2022-07-12 DIAGNOSIS — E230 Hypopituitarism: Secondary | ICD-10-CM

## 2022-07-12 DIAGNOSIS — E038 Other specified hypothyroidism: Secondary | ICD-10-CM

## 2022-07-12 DIAGNOSIS — E291 Testicular hypofunction: Secondary | ICD-10-CM

## 2022-07-12 MED ORDER — AMLODIPINE-OLMESARTAN 5-20 MG PO TABS
1.0000 | ORAL_TABLET | Freq: Every day | ORAL | 1 refills | Status: DC
Start: 2022-07-12 — End: 2022-10-12

## 2022-07-12 NOTE — Patient Instructions (Signed)
We are referring to [facility]

## 2022-07-12 NOTE — Progress Notes (Signed)
Assessment/Plan:   Problem List Items Addressed This Visit       Cardiovascular and Mediastinum   Primary hypertension - Primary    Blood pressure stable Azor refilled Follow-up in 6 months Return for BP.       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 male who has Adrenal insufficiency ([place]); Central hypothyroidism; Panhypopituitarism ([place]); Diabetes insipidus ([place]); Primary hypertension; and Brain tumor [facility]) on their problem list..   He  has a past medical history of History of brain surgery (02/2022), History of streptococcal sore throat, and Hypertension.Marland Kitchen   He presents with chief complaint of Follow-up (B/p follow up. Endocrinolgy referral needed due to changing insurances. Rx refill amlodipine) .   Hypertension, established problem, Stable BP Readings from Last 3 Encounters:  07/12/22 136/84  05/28/22 116/80  04/05/22 140/84    Current Medications: Amlodipine/olmesartan, compliant without side effects. Interim History: None  ROS: Denies any chest pain, shortness of breath, dyspnea on exertion, leg edema.   Panhypopituitarism.  Patient previously established with [facility] endocrinology.  Last seen on 05/2022.  Patient requesting referral to [facility] Endocr[facility]ology another office due to insurance change.  Past Surgical History:  Procedure Laterality Date    brain tumor removed  02/2022    Outpatient Medications Prior to Visit  Medication Sig Dispense Refill   desmopressin (DDAVP NASAL) 0.01 % solution Place into the nose.     hydrocortisone (CORTEF) 20 MG tablet Take 20 mg by mouth daily.     levothyroxine (SYNTHROID) 150 MCG tablet Take 150 mcg by mouth every morning.     omeprazole (PRILOSEC) 20 MG capsule Take 1 capsule by mouth daily.     predniSONE (DELTASONE) 5 MG tablet Take by mouth.     Testosterone 1.62 % GEL SMARTSIG:1 pump Topical Daily     amLODipine-olmesartan (AZOR) 5-20 MG tablet Take 1 tablet by mouth daily. 30 tablet 2   apixaban (ELIQUIS) 5 MG TABS tablet Take 5 mg by mouth 2 (two) times daily. (Patient not taking: Reported on 07/12/2022)     No facility-administered medications prior to visit.    No family history on file.  Social History   Socioeconomic History   Marital status: Married    Spouse name: Not on file   Number of children: Not on file   Years of education: Not on file   Highest education level: Not on file  Occupational History   Not on file  Tobacco Use   Smoking status: Former    Packs/day: 0.50    Types: Cigarettes   Smokeless tobacco: Current   Tobacco comments:    Vaping 3 days a week   Vaping Use   Vaping Use: Former  Substance and Sexual Activity   Alcohol use: Not Currently   Drug  use: Yes    Types: Marijuana   Sexual activity: Not on file  Other Topics Concern   Not on file  Social History Narrative   Not on file   Social Determinants of Health   Financial Resource Strain: Not on file  Food Insecurity: Not on file  Transportation Needs: Not on file  Physical Activity: Not on file  Stress: Not on file  Social Connections: Not on file  Intimate Partner Violence: Not on file                                                                                                 Objective:  Physical Exam: BP 136/84 (BP Location: Left Arm, Patient Position: Sitting, Cuff  Size: Large)   Pulse 78   Temp 97.8 F (36.6 C) (Temporal)   Wt (!) 318 lb 9.6 oz (144.5 kg)   SpO2 98%   BMI 42.03 kg/m    General: No acute distress. Awake and conversant.  Eyes: Normal conjunctiva, anicteric. Round symmetric pupils.  ENT: Hearing grossly intact. No nasal discharge.  Neck: Neck is supple. No masses or thyromegaly.  Respiratory: Respirations are non-labored. No auditory wheezing.  CTA B Skin: Warm. No rashes or ulcers.  Psych: Alert and oriented. Cooperative, Appropriate mood and affect, Normal judgment.  CV: No cyanosis or JVD, RRR, no MRG MSK: Normal ambulation. No clubbing, no lower extremity swelling Neuro: Sensation and CN II-XII grossly normal.        Alesia Banda, MD, MS

## 2022-07-12 NOTE — Assessment & Plan Note (Signed)
Blood pressure stable Azor refilled Follow-up in 6 months Return for BP.

## 2022-10-12 ENCOUNTER — Ambulatory Visit (INDEPENDENT_AMBULATORY_CARE_PROVIDER_SITE_OTHER): Payer: Commercial Managed Care - HMO | Admitting: Family Medicine

## 2022-10-12 ENCOUNTER — Encounter: Payer: Self-pay | Admitting: Family Medicine

## 2022-10-12 VITALS — BP 136/84 | HR 75 | Temp 97.3°F | Wt 340.4 lb

## 2022-10-12 DIAGNOSIS — E230 Hypopituitarism: Secondary | ICD-10-CM | POA: Diagnosis not present

## 2022-10-12 DIAGNOSIS — I10 Essential (primary) hypertension: Secondary | ICD-10-CM

## 2022-10-12 DIAGNOSIS — J069 Acute upper respiratory infection, unspecified: Secondary | ICD-10-CM | POA: Diagnosis not present

## 2022-10-12 MED ORDER — AMLODIPINE-OLMESARTAN 5-20 MG PO TABS: 1.0000 | ORAL_TABLET | Freq: Every day | ORAL | 1 refills | Status: DC

## 2022-10-12 NOTE — Assessment & Plan Note (Signed)
Stable.  Well-controlled on olmesartan/amlodipine.  Refill medication as ordered and follow-up in 6 months.

## 2022-10-12 NOTE — Progress Notes (Signed)
Assessment/Plan:   Problem List Items Addressed This Visit       Cardiovascular and Mediastinum   Primary hypertension    Stable.  Well-controlled on olmesartan/amlodipine.  Refill medication as ordered and follow-up in 6 months.      Relevant Medications   amLODipine-olmesartan (AZOR) 5-20 MG tablet     Respiratory   Viral URI with cough - Primary    Overall symptoms are mild.  The patient recognizes increased susceptibility due to his medical condition and is taking appropriate steps, including doubling the dose of hydrocortisone during sickness, which is a recommended adaptation.  Symptoms controlled with over-the-counter medications.  Patient declined COVID and flu testing today.  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 The recent change to testosterone injections is indicative of ongoing management and optimization of hormonal therapy. The increased use of hydrocortisone during illness is consistent with standard practice for patients with adrenal insufficiency to prevent adrenal crisis.  Plan: Continue current management of hypopituitarism and adrenal insufficiency. The patient is advised to monitor symptoms and continue taking increased doses of hydrocortisone during periods of illness as discussed. Follow up with endocrinology for the management of testosterone therapy and further evaluation of recent lab results. Scheduled follow-up in 6 months unless symptoms worsen or new issues arise.       Medications Discontinued During This Encounter  Medication Reason   Testosterone 1.62 % GEL    amLODipine-olmesartan (AZOR) 5-20 MG tablet Reorder      Subjective:  HPI: Encounter date: 10/12/2022  Hunter Huynh is a 42 y.o. male who has Adrenal insufficiency ([place]); Central hypothyroidism;  Panhypopituitarism ([place]); Diabetes insipidus ([place]); Primary hypertension; Brain tumor ([place]); and Viral URI with cough on their problem list..   He  has a past medical history of History of brain surgery (02/2022), History of streptococcal sore throat, and Hypertension.Marland Kitchen   He presents with chief complaint of Follow-up (B/p follow up. Fasting) .   CHIEF COMPLAINT: The patient presented for a blood pressure check and is being treated for hypopituitarism with multiple medications.  HISTORY OF PRESENT ILLNESS:  Problem 1: The patient, a 42 year old male, is currently on a regimen for hypopituitarism, including prednisone 5 mg, levothyroxine 150 micrograms, hydrocortisone 20 mg, desmopressin 0.01% nasal solution, and testosterone 1.62% gel. He reports that he has recently switched to testosterone injections due to the gel not achieving the desired effects as per his physician's assessment. He also mentions doubling up on hydrocortisone due to feeling sick which is in line with an adrenal insufficiency management plan during illnesses.  Patient recently had to switch to a different provider due to insurance coverage now sees Dr. Alanson Aly with [facility].  Problem 2: The patient reports generally feeling well but notes an increased susceptibility to illness, likely as a result of his underlying medical conditions and corticosteroid therapy. He is experiencing the onset of a cold and is managing it with over-the-counter medications in addition to an increased dose of hydrocortisone.  Problem 3: Patient has been taking his blood pressure medications without issue. The patient reports recent blood work by his endocrinologist but not yet received by this office. Previous labs indicate BMP GFR greater than 90, no electrolyte abnormalities. The patient has been advised to have the recent lab results sent over.  REVIEW OF SYSTEMS: The patient did not report any symptoms of chest pain or shortness of  breath and feels that the over-the-counter medications for his cold are effective. The remainder of the review of systems as documented was negative.  Past Surgical History:  Procedure Laterality Date   brain tumor removed  02/2022    Outpatient Medications Prior to Visit  Medication Sig Dispense Refill   desmopressin (DDAVP NASAL) 0.01 % solution Place into the nose.     hydrocortisone (CORTEF) 20 MG tablet Take 20 mg by mouth daily.     levothyroxine (SYNTHROID) 150 MCG tablet Take 150 mcg by mouth every morning.     omeprazole (PRILOSEC) 20 MG capsule Take 1 capsule by mouth daily.     amLODipine-olmesartan (AZOR) 5-20 MG tablet Take 1 tablet by mouth daily. 90 tablet 1   predniSONE (DELTASONE) 5 MG tablet Take by mouth.     Testosterone 1.62 % GEL SMARTSIG:1 pump Topical Daily (Patient not taking: Reported on 10/12/2022)     No facility-administered medications prior to visit.    No family history on file.  Social History   Socioeconomic History   Marital status: Married    Spouse name: Not on file   Number of children: Not on file   Years of education: Not on file   Highest education level: Not on file  Occupational History   Not on file  Tobacco Use   Smoking status: Former    Packs/day: 0.50    Types: Cigarettes   Smokeless tobacco: Current   Tobacco comments:    Vaping 3 days a week   Vaping Use   Vaping Use: Former  Substance and Sexual Activity   Alcohol use: Not Currently   Drug use: Yes    Types: Marijuana   Sexual activity: Not on file  Other Topics Concern   Not on file  Social History Narrative   Not on file   Social Determinants of Health   Financial Resource Strain: Not on file  Food Insecurity: Not on file  Transportation Needs: Not on file  Physical Activity: Not on file  Stress: Not on file  Social Connections: Not on file  Intimate Partner Violence: Not on file                                                                                                  Objective:  Physical Exam: BP 136/84 (BP Location: Left Arm, Patient Position: Sitting, Cuff Size: Large)   Pulse 75   Temp (!) 97.3 F (36.3 C) (Temporal)   Wt (!) 340 lb 6.4 oz (154.4 kg)   SpO2 98%   BMI 44.91 kg/m    Gen: NAD, resting comfortably CV: RRR with no murmurs appreciated Pulm: NWOB, CTAB with no crackles, wheezes, or rhonchi GI: Normal bowel sounds present. Soft, Nontender, Nondistended. MSK: no edema, cyanosis, or clubbing noted Skin: warm, dry Neuro: grossly normal, moves all extremities Psych: Normal affect and thought content        Alesia Banda, MD, MS

## 2022-10-12 NOTE — Assessment & Plan Note (Signed)
The patient has a history of hypopituitarism and is appropriately managed on multiple hormone replacement therapies. The recent change to testosterone injections is indicative of ongoing management and optimization of hormonal therapy. The increased use of hydrocortisone during illness is consistent with standard practice for patients with adrenal insufficiency to prevent adrenal crisis.  Plan: Continue current management of hypopituitarism and adrenal insufficiency. The patient is advised to monitor symptoms and continue taking increased doses of hydrocortisone during periods of illness as discussed. Follow up with endocrinology for the management of testosterone therapy and further evaluation of recent lab results. Scheduled follow-up in 6 months unless symptoms worsen or new issues arise.

## 2022-10-12 NOTE — Assessment & Plan Note (Addendum)
Overall symptoms are mild.  The patient recognizes increased susceptibility due to his medical condition and is taking appropriate steps, including doubling the dose of hydrocortisone during sickness, which is a recommended adaptation.  Symptoms controlled with over-the-counter medications.  Patient declined COVID and flu testing today.  Patient to follow-up if symptoms worsening or as needed

## 2022-10-12 NOTE — Patient Instructions (Signed)
Blood pressure is good. We refilled meds as discussed.   For cold, Please be sure to drink plenty of fluids.   You may take the following OTC medications to help with symptoms:  For cough, use  cough syrups or other cough suppressants.  For headache, sore throat, fevers, muscle aches, chills, other pain, take ibuprofen or tylenol  For congestion, use nasal sprays, decongestants, or antihistamines  Please follow up if no improvement.   Go to ED if you have severe chest pain, fevers, shortness of breath or other worrisome symptoms.

## 2022-11-25 ENCOUNTER — Encounter: Payer: Self-pay | Admitting: Family Medicine

## 2022-11-25 ENCOUNTER — Ambulatory Visit (INDEPENDENT_AMBULATORY_CARE_PROVIDER_SITE_OTHER): Payer: Commercial Managed Care - HMO | Admitting: Family Medicine

## 2022-11-25 VITALS — BP 138/86 | HR 77 | Temp 97.6°F | Wt 352.0 lb

## 2022-11-25 DIAGNOSIS — Z711 Person with feared health complaint in whom no diagnosis is made: Secondary | ICD-10-CM

## 2022-11-25 DIAGNOSIS — E349 Endocrine disorder, unspecified: Secondary | ICD-10-CM

## 2022-11-25 DIAGNOSIS — Z818 Family history of other mental and behavioral disorders: Secondary | ICD-10-CM

## 2022-11-25 DIAGNOSIS — Z86711 Personal history of pulmonary embolism: Secondary | ICD-10-CM | POA: Diagnosis not present

## 2022-11-25 DIAGNOSIS — E2740 Unspecified adrenocortical insufficiency: Secondary | ICD-10-CM

## 2022-11-25 DIAGNOSIS — E232 Diabetes insipidus: Secondary | ICD-10-CM | POA: Diagnosis not present

## 2022-11-25 DIAGNOSIS — M7989 Other specified soft tissue disorders: Secondary | ICD-10-CM | POA: Insufficient documentation

## 2022-11-25 DIAGNOSIS — E230 Hypopituitarism: Secondary | ICD-10-CM | POA: Diagnosis not present

## 2022-11-25 DIAGNOSIS — I10 Essential (primary) hypertension: Secondary | ICD-10-CM

## 2022-11-25 DIAGNOSIS — R601 Generalized edema: Secondary | ICD-10-CM | POA: Insufficient documentation

## 2022-11-25 LAB — URINALYSIS, ROUTINE W REFLEX MICROSCOPIC
Bilirubin Urine: NEGATIVE
Hgb urine dipstick: NEGATIVE
Ketones, ur: NEGATIVE
Leukocytes,Ua: NEGATIVE
Nitrite: NEGATIVE
RBC / HPF: NONE SEEN (ref 0–?)
Specific Gravity, Urine: 1.015 (ref 1.000–1.030)
Total Protein, Urine: NEGATIVE
Urine Glucose: NEGATIVE
Urobilinogen, UA: 0.2 (ref 0.0–1.0)
pH: 8 (ref 5.0–8.0)

## 2022-11-25 LAB — CBC WITH DIFFERENTIAL/PLATELET
Basophils Absolute: 0 10*3/uL (ref 0.0–0.1)
Basophils Relative: 0.3 % (ref 0.0–3.0)
Eosinophils Absolute: 0.3 10*3/uL (ref 0.0–0.7)
Eosinophils Relative: 4.8 % (ref 0.0–5.0)
HCT: 43.2 % (ref 39.0–52.0)
Hemoglobin: 14.1 g/dL (ref 13.0–17.0)
Lymphocytes Relative: 37 % (ref 12.0–46.0)
Lymphs Abs: 2.3 10*3/uL (ref 0.7–4.0)
MCHC: 32.7 g/dL (ref 30.0–36.0)
MCV: 86.2 fl (ref 78.0–100.0)
Monocytes Absolute: 0.7 10*3/uL (ref 0.1–1.0)
Monocytes Relative: 11.5 % (ref 3.0–12.0)
Neutro Abs: 2.9 10*3/uL (ref 1.4–7.7)
Neutrophils Relative %: 46.4 % (ref 43.0–77.0)
Platelets: 374 10*3/uL (ref 150.0–400.0)
RBC: 5.01 Mil/uL (ref 4.22–5.81)
RDW: 15.9 % — ABNORMAL HIGH (ref 11.5–15.5)
WBC: 6.2 10*3/uL (ref 4.0–10.5)

## 2022-11-25 LAB — COMPREHENSIVE METABOLIC PANEL
ALT: 25 U/L (ref 0–53)
AST: 21 U/L (ref 0–37)
Albumin: 3.8 g/dL (ref 3.5–5.2)
Alkaline Phosphatase: 92 U/L (ref 39–117)
BUN: 9 mg/dL (ref 6–23)
CO2: 31 mEq/L (ref 19–32)
Calcium: 9.7 mg/dL (ref 8.4–10.5)
Chloride: 103 mEq/L (ref 96–112)
Creatinine, Ser: 1.08 mg/dL (ref 0.40–1.50)
GFR: 86.38 mL/min (ref >60.00)
Glucose, Bld: 105 mg/dL — ABNORMAL HIGH (ref 70–99)
Potassium: 4.5 mEq/L (ref 3.5–5.1)
Sodium: 139 mEq/L (ref 135–145)
Total Bilirubin: 0.5 mg/dL (ref 0.2–1.2)
Total Protein: 6.6 g/dL (ref 6.0–8.3)

## 2022-11-25 LAB — BRAIN NATRIURETIC PEPTIDE: Pro B Natriuretic peptide (BNP): 10 pg/mL (ref 0.0–100.0)

## 2022-11-25 NOTE — Progress Notes (Signed)
Assessment/Plan:   Problem List Items Addressed This Visit       Cardiovascular and Mediastinum   Primary hypertension    Continue current antihypertensive medication. Monitor blood pressure and adjust therapy as needed based on upcoming lab results.        Endocrine   Adrenal insufficiency (HCC)   Relevant Orders   Comp Met (CMET)   CBC w/Diff   Urinalysis, Routine w reflex microscopic   Testosterone,Free and Total   Thyroid Panel With TSH   DHEA-sulfate   US Venous Img Lower Bilateral   Panhypopituitarism (HCC)   Relevant Orders   Comp Met (CMET)   CBC w/Diff   Urinalysis, Routine w reflex microscopic   Testosterone,Free and Total   Thyroid Panel With TSH   DHEA-sulfate   US Venous Img Lower Bilateral   Diabetes insipidus (HCC)   Relevant Orders   Comp Met (CMET)   CBC w/Diff   Urinalysis, Routine w reflex microscopic   Testosterone,Free and Total   Thyroid Panel With TSH   DHEA-sulfate   US Venous Img Lower Bilateral     Other   Testosterone deficiency   Relevant Orders   Comp Met (CMET)   CBC w/Diff   Urinalysis, Routine w reflex microscopic   Testosterone,Free and Total   Thyroid Panel With TSH   DHEA-sulfate   US Venous Img Lower Bilateral   History of pulmonary embolus (PE)   Relevant Orders   US Venous Img Lower Bilateral   Swelling of both hands   Relevant Orders   Comp Met (CMET)   CBC w/Diff   Urinalysis, Routine w reflex microscopic   Testosterone,Free and Total   Thyroid Panel With TSH   DHEA-sulfate   US Venous Img Lower Bilateral   B Nat Peptide   Family history of bipolar disorder   Leg swelling    Assess for DVT, venous insufficiency, or revisit testosterone therapy as a potential cause of edema.  Differential diagnosis:  Venous insufficiency Medication-induced edema Heart failure  Plan:  Obtain bilateral venous Doppler imaging to rule out deep vein thrombosis. Re-evaluate current medications, particularly recent  hormone replacement therapy. Consider diuretics if no evidence of deep vein thrombosis and if labs do not indicate renal or heart issues.      Relevant Orders   Comp Met (CMET)   CBC w/Diff   Urinalysis, Routine w reflex microscopic   Testosterone,Free and Total   Thyroid Panel With TSH   DHEA-sulfate   US Venous Img Lower Bilateral   B Nat Peptide   Concern about mental disorder without diagnosis - Primary    Evaluate the impact of hormone replacement therapy on emotional stability.  Differential diagnosis:  Adjustment disorder due to recent health changes and hormone therapy Mood disorder due to endocrine changes Bipolar disorder given family history  Plan: Monitor impact of hormone replacement therapy on mood and consult with endocrinologist for management adjustments If worsening symptoms, consider refer to psychiatry for formal evaluation and management of mood symptoms.      Relevant Orders   Comp Met (CMET)   CBC w/Diff   Urinalysis, Routine w reflex microscopic   Testosterone,Free and Total   Thyroid Panel With TSH   DHEA-sulfate   US Venous Img Lower Bilateral    Medications Discontinued During This Encounter  Medication Reason   predniSONE (DELTASONE) 5 MG tablet       Subjective:  HPI: Encounter date: 11/25/2022  Hunter Huynh is a 42 y.o.  male who has Adrenal insufficiency ([place]); Central hypothyroidism; Panhypopituitarism ([place]); Diabetes insipidus ([place]); Primary hypertension; Brain tumor ([place]); Testosterone deficiency; History of pulmonary embolus (PE); Swelling of both hands; Family history of bipolar disorder; Leg swelling; and Concern about mental disorder without diagnosis on their problem list..   He  has a past medical history of History of brain surgery (02/2022), History of streptococcal sore throat, and Hypertension..   CHIEF COMPLAINT: Hunter Huynh, a 42 year old male, presents for medical management of chronic issues, including high blood  pressure follow-up, swelling in hands and lower legs, bloating for 3 weeks, and a referral for mental health concerns.  HISTORY OF PRESENT ILLNESS:   Blood Pressure Management: Hunter Huynh reports his blood pressure has been fine, with no reported symptoms chest pain, shortness of breath, blurry vision, headaches.  Edema: Over the past few weeks, Hunter Huynh has noted swelling in his hands and lower legs. Specifically, he mentions that his work shoes feel tight and that he notices extra edema in his feet, particularly without accompanying calf pain. However, he does report pain in left foot occasionally.  Mental Health: Hunter Huynh expresses concerns related to mental health, noting emotional instability characterized by episodes of crying at work and at home. He reports a family history of bipolar disorder, with his sister being affected, and suggests that hormone replacement therapy might be contributing to his emotional symptoms. Additionally, he mentions a history of taking a court-ordered anger management class that also addressed coping strategies.     11/25/2022   10:03 AM 04/05/2022    8:51 AM  Depression screen PHQ 2/9  Decreased Interest 1 0  Down, Depressed, Hopeless 0 0  PHQ - 2 Score 1 0  Altered sleeping 0   Tired, decreased energy 2   Change in appetite 1   Feeling bad or failure about yourself  1   Trouble concentrating 0   Moving slowly or fidgety/restless 0   Suicidal thoughts 0   PHQ-9 Score 5   Difficult doing work/chores Somewhat difficult       11/25/2022   10:03 AM  GAD 7 : Generalized Anxiety Score  Nervous, Anxious, on Edge 1  Control/stop worrying 1  Worry too much - different things 1  Trouble relaxing 0  Restless 0  Easily annoyed or irritable 2  Afraid - awful might happen 0  Total GAD 7 Score 5  Anxiety Difficulty Somewhat difficult     Past Surgical History:  Procedure Laterality Date   brain tumor removed  02/2022    Outpatient Medications Prior to Visit   Medication Sig Dispense Refill   amLODipine-olmesartan (AZOR) 5-20 MG tablet Take 1 tablet by mouth daily. 90 tablet 1   desmopressin (DDAVP NASAL) 0.01 % solution Place into the nose.     hydrocortisone (CORTEF) 20 MG tablet Take 20 mg by mouth daily.     levothyroxine (SYNTHROID) 150 MCG tablet Take 150 mcg by mouth every morning.     omeprazole (PRILOSEC) 20 MG capsule Take 1 capsule by mouth daily.     predniSONE (DELTASONE) 5 MG tablet Take by mouth.     No facility-administered medications prior to visit.    Family History  Problem Relation Age of Onset   Bipolar disorder Sister     Social History   Socioeconomic History   Marital status: Married    Spouse name: Not on file   Number of children: Not on file   Years of education: Not on file   Highest  education level: Not on file  Occupational History   Not on file  Tobacco Use   Smoking status: Former    Packs/day: 0.50    Types: Cigarettes   Smokeless tobacco: Not on file   Tobacco comments:    Vaping 3 days a week   Vaping Use   Vaping Use: Some days  Substance and Sexual Activity   Alcohol use: Not Currently   Drug use: Yes    Types: Marijuana   Sexual activity: Not on file  Other Topics Concern   Not on file  Social History Narrative   Not on file   Social Determinants of Health   Financial Resource Strain: Not on file  Food Insecurity: Not on file  Transportation Needs: Not on file  Physical Activity: Not on file  Stress: Not on file  Social Connections: Not on file  Intimate Partner Violence: Not on file                                                                                                 Objective:  Physical Exam: BP 138/86 (BP Location: Left Arm, Patient Position: Sitting, Cuff Size: Large)   Pulse 77   Temp 97.6 F (36.4 C) (Temporal)   Wt (!) 352 lb (159.7 kg)   SpO2 99%   BMI 46.44 kg/m    Swollen upper extremities and hands Bilateral lower extremity 2+ pitting edema  throughout skin Physical Exam Constitutional:      General: He is not in acute distress.    Appearance: Normal appearance. He is not ill-appearing or toxic-appearing.  HENT:     Head: Normocephalic.     Right Ear: Tympanic membrane, ear canal and external ear normal. There is no impacted cerumen.     Left Ear: Tympanic membrane, ear canal and external ear normal. There is no impacted cerumen.     Nose: Nose normal. No congestion.     Mouth/Throat:     Mouth: Mucous membranes are moist.     Pharynx: Oropharynx is clear. No oropharyngeal exudate.  Eyes:     General: No scleral icterus.       Right eye: No discharge.        Left eye: No discharge.     Conjunctiva/sclera: Conjunctivae normal.     Pupils: Pupils are equal, round, and reactive to light.  Cardiovascular:     Rate and Rhythm: Normal rate and regular rhythm.     Pulses: Normal pulses.     Heart sounds: Normal heart sounds.  Pulmonary:     Effort: Pulmonary effort is normal. No respiratory distress.     Breath sounds: Normal breath sounds.  Abdominal:     General: Abdomen is flat. Bowel sounds are normal.     Palpations: Abdomen is soft.  Musculoskeletal:        General: Normal range of motion.     Right hand: Swelling present. No tenderness. Normal strength.     Left hand: Swelling present. No tenderness. Normal strength.     Cervical back: Normal range of motion.     Right  lower leg: 2+ Pitting Edema present.     Left lower leg: 2+ Pitting Edema present.  Lymphadenopathy:     Cervical: No cervical adenopathy.  Skin:    General: Skin is warm and dry.     Findings: No rash.  Neurological:     General: No focal deficit present.     Mental Status: He is alert and oriented to person, place, and time. Mental status is at baseline.  Psychiatric:        Mood and Affect: Mood normal.        Behavior: Behavior normal.        Thought Content: Thought content normal.        Judgment: Judgment normal.          Alesia Banda, MD, MS

## 2022-11-25 NOTE — Assessment & Plan Note (Signed)
Evaluate the impact of hormone replacement therapy on emotional stability.  Differential diagnosis:  Adjustment disorder due to recent health changes and hormone therapy Mood disorder due to endocrine changes Bipolar disorder given family history  Plan: Monitor impact of hormone replacement therapy on mood and consult with endocrinologist for management adjustments If worsening symptoms, consider refer to psychiatry for formal evaluation and management of mood symptoms.

## 2022-11-25 NOTE — Assessment & Plan Note (Signed)
Assess for DVT, venous insufficiency, or revisit testosterone therapy as a potential cause of edema.  Differential diagnosis:  Venous insufficiency Medication-induced edema Heart failure  Plan:  Obtain bilateral venous Doppler imaging to rule out deep vein thrombosis. Re-evaluate current medications, particularly recent hormone replacement therapy. Consider diuretics if no evidence of deep vein thrombosis and if labs do not indicate renal or heart issues.

## 2022-11-25 NOTE — Assessment & Plan Note (Signed)
Continue current antihypertensive medication. Monitor blood pressure and adjust therapy as needed based on upcoming lab results.

## 2022-11-25 NOTE — Patient Instructions (Addendum)
We are checking blood work as discussed.  We are ordering ultrasound to rule out blood clot.  Please follow-up with Dr. Meredith Pel regarding swelling and mood changes.  Please go to emergency department if you get chest pain or shortness of breath

## 2022-11-26 LAB — THYROID PANEL WITH TSH
Free Thyroxine Index: 2.5 (ref 1.4–3.8)
T3 Uptake: 28 % (ref 22–35)
T4, Total: 8.8 ug/dL (ref 4.9–10.5)
TSH: 0.02 mIU/L — ABNORMAL LOW (ref 0.40–4.50)

## 2022-11-26 LAB — DHEA-SULFATE: DHEA-SO4: 131 ug/dL (ref 93–415)

## 2022-11-28 LAB — TESTOSTERONE,FREE AND TOTAL
Testosterone, Free: 8.9 pg/mL (ref 8.7–25.1)
Testosterone: 522 ng/dL (ref 264–916)

## 2022-12-01 ENCOUNTER — Ambulatory Visit (HOSPITAL_BASED_OUTPATIENT_CLINIC_OR_DEPARTMENT_OTHER)
Admission: RE | Admit: 2022-12-01 | Discharge: 2022-12-01 | Disposition: A | Discharge status: Home / Self Care | Payer: Commercial Managed Care - HMO | Source: Ambulatory Visit | Attending: Family Medicine | Admitting: Family Medicine

## 2022-12-01 DIAGNOSIS — E232 Diabetes insipidus: Secondary | ICD-10-CM | POA: Diagnosis present

## 2022-12-01 DIAGNOSIS — Z711 Person with feared health complaint in whom no diagnosis is made: Secondary | ICD-10-CM

## 2022-12-01 DIAGNOSIS — E349 Endocrine disorder, unspecified: Secondary | ICD-10-CM | POA: Diagnosis present

## 2022-12-01 DIAGNOSIS — M7989 Other specified soft tissue disorders: Secondary | ICD-10-CM | POA: Insufficient documentation

## 2022-12-01 DIAGNOSIS — E2740 Unspecified adrenocortical insufficiency: Secondary | ICD-10-CM | POA: Diagnosis present

## 2022-12-01 DIAGNOSIS — Z86711 Personal history of pulmonary embolism: Secondary | ICD-10-CM | POA: Diagnosis present

## 2022-12-01 DIAGNOSIS — E230 Hypopituitarism: Secondary | ICD-10-CM

## 2022-12-28 ENCOUNTER — Ambulatory Visit: Payer: Commercial Managed Care - HMO | Admitting: Family Medicine

## 2023-01-11 ENCOUNTER — Ambulatory Visit (INDEPENDENT_AMBULATORY_CARE_PROVIDER_SITE_OTHER): Payer: Commercial Managed Care - HMO | Admitting: Family Medicine

## 2023-01-11 VITALS — BP 138/84 | HR 80 | Temp 98.0°F | Wt 361.2 lb

## 2023-01-11 DIAGNOSIS — R0683 Snoring: Secondary | ICD-10-CM

## 2023-01-11 DIAGNOSIS — I10 Essential (primary) hypertension: Secondary | ICD-10-CM | POA: Diagnosis not present

## 2023-01-11 DIAGNOSIS — M7989 Other specified soft tissue disorders: Secondary | ICD-10-CM | POA: Diagnosis not present

## 2023-01-11 DIAGNOSIS — R601 Generalized edema: Secondary | ICD-10-CM

## 2023-01-11 MED ORDER — FUROSEMIDE 20 MG PO TABS: 20.0000 mg | ORAL_TABLET | Freq: Four times a day (QID) | ORAL | 0 refills | Status: DC | PRN

## 2023-01-11 MED ORDER — MEDICAL COMPRESSION STOCKINGS MISC: 1.0000 | 0 refills | Status: DC | PRN

## 2023-01-11 NOTE — Patient Instructions (Signed)
We are ordering echocardiogram to assess for heart related swelling issues. We are ordering sleep study to assess for apnea. We are starting Lasix for leg swelling and blood pressure.

## 2023-01-11 NOTE — Assessment & Plan Note (Signed)
Mildly elevated blood pressure; endocrinology recently adjusted antihypertensive regimen with cessation of amlodipine and increased olmesartan to 40 mg. Adding Lasix as ordered Monitor blood pressure closely

## 2023-01-11 NOTE — Progress Notes (Signed)
Assessment/Plan:   Problem List Items Addressed This Visit       Cardiovascular and Mediastinum   Primary hypertension - Primary    Mildly elevated blood pressure; endocrinology recently adjusted antihypertensive regimen with cessation of amlodipine and increased olmesartan to 40 mg. Adding Lasix as ordered Monitor blood pressure closely      Relevant Medications   OLMESARTAN MEDOXOMIL PO   furosemide (LASIX) 20 MG tablet     Other   Generalized edema worse in lower extremities    Endorsed generalized edema, however trace of any seen in upper extremities and abdomen on exam, definitely visible in lower extremities with left worse than right.  Recent workup thus far negative.   Differential Diagnosis: Venous insufficiency Congestive heart failure Hormone-related fluid retention  Renal pathology  Plan: Initiate diuretic therapy (Lasix 20 mg every 6 hours as needed). Order an echocardiogram to evaluate heart function. Arrange sleep study referral due to reported snoring to rule out sleep apnea as a contributory factor. Monitor and reassess swelling response to diuretic therapy. Continue to review hormone levels and adjust therapy with endocrinology if needed. Consider compression stockings to mechanically reduce swelling. Re-evaluate in one month for blood pressure and response to treatment of leg swelling.      Relevant Medications   furosemide (LASIX) 20 MG tablet   Elastic Bandages & Supports (MEDICAL COMPRESSION STOCKINGS) MISC   Other Visit Diagnoses     Snoring       Relevant Orders   Ambulatory referral to Sleep Studies       Medications Discontinued During This Encounter  Medication Reason   amLODipine-olmesartan (AZOR) 5-20 MG tablet     Return in about 4 weeks (around 02/08/2023) for BP, leg swelling.    Subjective:   Encounter date: 01/11/2023  Hunter Huynh is a 42 y.o. male who has Adrenal insufficiency; Central hypothyroidism;  Panhypopituitarism; Diabetes insipidus; Primary hypertension; Brain tumor; Testosterone deficiency; History of pulmonary embolus (PE); Swelling of both hands; Family history of bipolar disorder; Generalized edema worse in lower extremities; and Concern about mental disorder without diagnosis on their problem list..   He  has a past medical history of History of brain surgery (02/2022), History of streptococcal sore throat, and Hypertension..   CHIEF COMPLAINT: Patient presents for medical management of chronic issues, with concerns regarding persistent swelling of the legs and a systemic feeling of water weight gain, despite changes in medication.  HISTORY OF PRESENT ILLNESS:  Leg Swelling: Patient reports bilateral leg swelling, with symptoms improving in the morning but worsening significantly by the end of the day, described as "balloon-like." He lower extremity Doppler with no evidence of venous reflux or lower extremity thrombosis.  He also mentions of systemic swelling, including in the abdomen and hands saying that his rings do not fit well anymore. The patient suspects the testosterone injections may be contributing to the edema.  In attempts to reduce swelling, endocrinology recently changed his antihypertensives by discontinuing amlodipine 5 mg and increasing olmesartan to 40 mg for blood pressure control, however, this has not resolved the swelling.  Review of Systems  Constitutional:  Negative for chills, diaphoresis, fever, malaise/fatigue and weight loss.  HENT:  Negative for congestion, ear discharge, ear pain and hearing loss.   Eyes:  Negative for blurred vision, double vision, photophobia, pain, discharge and redness.  Respiratory:  Negative for cough, sputum production, shortness of breath and wheezing.   Cardiovascular:  Positive for leg swelling. Negative for chest pain, palpitations,  orthopnea and PND.  Gastrointestinal:  Negative for abdominal pain, blood in stool,  constipation, diarrhea, heartburn, melena, nausea and vomiting.  Genitourinary:  Negative for dysuria, flank pain, frequency, hematuria and urgency.  Musculoskeletal:  Negative for myalgias.  Skin:  Negative for itching and rash.  Neurological:  Negative for dizziness, tingling, tremors, speech change, seizures, loss of consciousness, weakness and headaches.  Endo/Heme/Allergies:  Negative for polydipsia.  Psychiatric/Behavioral:  Negative for depression, hallucinations, memory loss, substance abuse and suicidal ideas. The patient does not have insomnia.   All other systems reviewed and are negative.   Past Surgical History:  Procedure Laterality Date   brain tumor removed  02/2022    Outpatient Medications Prior to Visit  Medication Sig Dispense Refill   hydrocortisone (CORTEF) 20 MG tablet Take 20 mg by mouth daily.     levothyroxine (SYNTHROID) 150 MCG tablet Take 150 mcg by mouth every morning.     OLMESARTAN MEDOXOMIL PO 40 mg.     omeprazole (PRILOSEC) 20 MG capsule Take 1 capsule by mouth daily.     desmopressin (DDAVP NASAL) 0.01 % solution Place into the nose. (Patient not taking: Reported on 01/11/2023)     amLODipine-olmesartan (AZOR) 5-20 MG tablet Take 1 tablet by mouth daily. (Patient not taking: Reported on 01/11/2023) 90 tablet 1   No facility-administered medications prior to visit.    Family History  Problem Relation Age of Onset   Bipolar disorder Sister     Social History   Socioeconomic History   Marital status: Married    Spouse name: Not on file   Number of children: Not on file   Years of education: Not on file   Highest education level: 12th grade  Occupational History   Not on file  Tobacco Use   Smoking status: Former    Packs/day: .5    Types: Cigarettes    Passive exposure: Never   Smokeless tobacco: Never   Tobacco comments:    Vaping 3 days a week   Vaping Use   Vaping Use: Some days  Substance and Sexual Activity   Alcohol use: Not  Currently   Drug use: Yes    Types: Marijuana   Sexual activity: Not on file  Other Topics Concern   Not on file  Social History Narrative   Not on file   Social Determinants of Health   Financial Resource Strain: Low Risk  (01/11/2023)   Overall Financial Resource Strain (CARDIA)    Difficulty of Paying Living Expenses: Not hard at all  Food Insecurity: No Food Insecurity (01/11/2023)   Hunger Vital Sign    Worried About Running Out of Food in the Last Year: Never true    Ran Out of Food in the Last Year: Never true  Transportation Needs: No Transportation Needs (01/11/2023)   PRAPARE - Administrator, Civil Service (Medical): No    Lack of Transportation (Non-Medical): No  Physical Activity: Insufficiently Active (01/11/2023)   Exercise Vital Sign    Days of Exercise per Week: 1 day    Minutes of Exercise per Session: 10 min  Stress: No Stress Concern Present (01/11/2023)   [employer] of Occupational Health - Occupational Stress Questionnaire    Feeling of Stress : Only a little  Social Connections: Socially Integrated (01/11/2023)   Social Connection and Isolation Panel [NHANES]    Frequency of Communication with Friends and Family: More than three times a week    Frequency of Social Gatherings with  Friends and Family: Three times a week    Attends Religious Services: More than 4 times per year    Active Member of Clubs or Organizations: Yes    Attends Engineer, structural: More than 4 times per year    Marital Status: Married  Catering manager Violence: Not on file                                                                                                  Objective:  Physical Exam: BP 138/84 (BP Location: Left Arm, Patient Position: Sitting, Cuff Size: Large)   Pulse 80   Temp 98 F (36.7 C) (Temporal)   Wt (!) 361 lb 3.2 oz (163.8 kg)   SpO2 99%   BMI 47.65 kg/m     Physical Exam Constitutional:      Appearance: Normal  appearance.  HENT:     Head: Normocephalic and atraumatic.     Right Ear: Hearing normal.     Left Ear: Hearing normal.     Nose: Nose normal.  Eyes:     General: No scleral icterus.       Right eye: No discharge.        Left eye: No discharge.     Extraocular Movements: Extraocular movements intact.  Cardiovascular:     Rate and Rhythm: Normal rate and regular rhythm.     Heart sounds: Normal heart sounds.  Pulmonary:     Effort: Pulmonary effort is normal.     Breath sounds: Normal breath sounds.  Abdominal:     Palpations: Abdomen is soft. There is no shifting dullness or fluid wave.     Tenderness: There is no abdominal tenderness.  Musculoskeletal:     Right forearm: No swelling.     Left forearm: No swelling.     Right wrist: No swelling.     Left wrist: No swelling.     Right hand: No swelling.     Left hand: No swelling.     Right lower leg: 2+ Pitting Edema present.     Left lower leg: 2+ Pitting Edema present.  Skin:    General: Skin is warm.     Findings: No rash.  Neurological:     General: No focal deficit present.     Mental Status: He is alert.     Cranial Nerves: No cranial nerve deficit.  Psychiatric:        Mood and Affect: Mood normal.        Behavior: Behavior normal.        Thought Content: Thought content normal.        Judgment: Judgment normal.     US Venous Img Lower Bilateral  Result Date: 12/01/2022 CLINICAL DATA:  Bilateral leg swelling. EXAM: BILATERAL LOWER EXTREMITY VENOUS DOPPLER ULTRASOUND TECHNIQUE: Gray-scale sonography with graded compression, as well as color Doppler and duplex ultrasound were performed to evaluate the lower extremity deep venous systems from the level of the common femoral vein and including the common femoral, femoral, profunda femoral, popliteal and calf veins including the posterior tibial, peroneal and  gastrocnemius veins when visible. The superficial great saphenous vein was also interrogated. Spectral Doppler  was utilized to evaluate flow at rest and with distal augmentation maneuvers in the common femoral, femoral and popliteal veins. COMPARISON:  None Available. FINDINGS: RIGHT LOWER EXTREMITY Common Femoral Vein: No evidence of thrombus. Normal compressibility, respiratory phasicity and response to augmentation. Saphenofemoral Junction: No evidence of thrombus. Normal compressibility and flow on color Doppler imaging. Profunda Femoral Vein: No evidence of thrombus. Normal compressibility and flow on color Doppler imaging. Femoral Vein: No evidence of thrombus. Normal compressibility, respiratory phasicity and response to augmentation. Popliteal Vein: No evidence of thrombus. Normal compressibility, respiratory phasicity and response to augmentation. Calf Veins: No evidence of thrombus. Normal compressibility and flow on color Doppler imaging. Superficial Great Saphenous Vein: No evidence of thrombus. Normal compressibility. Venous Reflux:  None. Other Findings:  None. LEFT LOWER EXTREMITY Common Femoral Vein: No evidence of thrombus. Normal compressibility, respiratory phasicity and response to augmentation. Saphenofemoral Junction: No evidence of thrombus. Normal compressibility and flow on color Doppler imaging. Profunda Femoral Vein: No evidence of thrombus. Normal compressibility and flow on color Doppler imaging. Femoral Vein: No evidence of thrombus. Normal compressibility, respiratory phasicity and response to augmentation. Popliteal Vein: No evidence of thrombus. Normal compressibility, respiratory phasicity and response to augmentation. Calf Veins: No evidence of thrombus. Normal compressibility and flow on color Doppler imaging. Superficial Great Saphenous Vein: No evidence of thrombus. Normal compressibility. Venous Reflux:  None. Other Findings:  None. IMPRESSION: No evidence of deep venous thrombosis in either lower extremity. Electronically Signed   By: Obie Dredge M.D.   On: 12/01/2022 11:30     Recent Results (from the past 2160 hour(s))  Comp Met (CMET)     Status: Abnormal   Collection Time: 11/25/22 10:01 AM  Result Value Ref Range   Sodium 139 135 - 145 mEq/L   Potassium 4.5 3.5 - 5.1 mEq/L   Chloride 103 96 - 112 mEq/L   CO2 31 19 - 32 mEq/L   Glucose, Bld 105 (H) 70 - 99 mg/dL   BUN 9 6 - 23 mg/dL   Creatinine, Ser 1.61 0.40 - 1.50 mg/dL   Total Bilirubin 0.5 0.2 - 1.2 mg/dL   Alkaline Phosphatase 92 39 - 117 U/L   AST 21 0 - 37 U/L   ALT 25 0 - 53 U/L   Total Protein 6.6 6.0 - 8.3 g/dL   Albumin 3.8 3.5 - 5.2 g/dL   GFR 09.60 >45.40 mL/min    Comment: Calculated using the CKD-EPI Creatinine Equation (2021)   Calcium 9.7 8.4 - 10.5 mg/dL  CBC w/Diff     Status: Abnormal   Collection Time: 11/25/22 10:01 AM  Result Value Ref Range   WBC 6.2 4.0 - 10.5 K/uL   RBC 5.01 4.22 - 5.81 Mil/uL   Hemoglobin 14.1 13.0 - 17.0 g/dL   HCT 98.1 19.1 - 47.8 %   MCV 86.2 78.0 - 100.0 fl   MCHC 32.7 30.0 - 36.0 g/dL   RDW 29.5 (H) 62.1 - 30.8 %   Platelets 374.0 150.0 - 400.0 K/uL   Neutrophils Relative % 46.4 43.0 - 77.0 %   Lymphocytes Relative 37.0 12.0 - 46.0 %   Monocytes Relative 11.5 3.0 - 12.0 %   Eosinophils Relative 4.8 0.0 - 5.0 %   Basophils Relative 0.3 0.0 - 3.0 %   Neutro Abs 2.9 1.4 - 7.7 K/uL   Lymphs Abs 2.3 0.7 - 4.0 K/uL   Monocytes  Absolute 0.7 0.1 - 1.0 K/uL   Eosinophils Absolute 0.3 0.0 - 0.7 K/uL   Basophils Absolute 0.0 0.0 - 0.1 K/uL  Urinalysis, Routine w reflex microscopic     Status: None   Collection Time: 11/25/22 10:01 AM  Result Value Ref Range   Color, Urine YELLOW Yellow;Lt. Yellow;Straw;Dark Yellow;Amber;Green;Red;Brown   APPearance CLEAR Clear;Turbid;Slightly Cloudy;Cloudy   Specific Gravity, Urine 1.015 1.000 - 1.030   pH 8.0 5.0 - 8.0   Total Protein, Urine NEGATIVE Negative   Urine Glucose NEGATIVE Negative   Ketones, ur NEGATIVE Negative   Bilirubin Urine NEGATIVE Negative   Hgb urine dipstick NEGATIVE Negative    Urobilinogen, UA 0.2 0.0 - 1.0   Leukocytes,Ua NEGATIVE Negative   Nitrite NEGATIVE Negative   WBC, UA 0-2/hpf 0-2/hpf   RBC / HPF none seen 0-2/hpf  Testosterone,Free and Total     Status: None   Collection Time: 11/25/22 10:01 AM  Result Value Ref Range   Testosterone 522 264 - 916 ng/dL    Comment: Adult male reference interval is based on a population of healthy nonobese males (BMI <30) between 75 and 38 years old. Travison, et.al. JCEM [phone]. PMID: [id number].    Testosterone, Free 8.9 8.7 - 25.1 pg/mL  Thyroid Panel With TSH     Status: Abnormal   Collection Time: 11/25/22 10:01 AM  Result Value Ref Range   T3 Uptake 28 22 - 35 %   T4, Total 8.8 4.9 - 10.5 mcg/dL   Free Thyroxine Index 2.5 1.4 - 3.8   TSH 0.02 (L) 0.40 - 4.50 mIU/L  DHEA-sulfate     Status: None   Collection Time: 11/25/22 10:01 AM  Result Value Ref Range   DHEA-SO4 131 93 - 415 mcg/dL  B Nat Peptide     Status: None   Collection Time: 11/25/22 10:01 AM  Result Value Ref Range   Pro B Natriuretic peptide (BNP) 10.0 0.0 - 100.0 pg/mL        Garner Nash, MD, MS

## 2023-01-11 NOTE — Assessment & Plan Note (Addendum)
Endorsed generalized edema, however trace of any seen in upper extremities and abdomen on exam, definitely visible in lower extremities with left worse than right.  Recent workup thus far negative.   Differential Diagnosis: Venous insufficiency Congestive heart failure Hormone-related fluid retention  Renal pathology  Plan: Initiate diuretic therapy (Lasix 20 mg every 6 hours as needed). Order an echocardiogram to evaluate heart function. Arrange sleep study referral due to reported snoring to rule out sleep apnea as a contributory factor. Monitor and reassess swelling response to diuretic therapy. Continue to review hormone levels and adjust therapy with endocrinology if needed. Consider compression stockings to mechanically reduce swelling. Re-evaluate in one month for blood pressure and response to treatment of leg swelling.

## 2023-02-01 ENCOUNTER — Ambulatory Visit (HOSPITAL_COMMUNITY)
Admission: RE | Admit: 2023-02-01 | Discharge: 2023-02-01 | Disposition: A | Discharge status: Home / Self Care | Payer: Commercial Managed Care - HMO | Source: Ambulatory Visit | Attending: Family Medicine | Admitting: Family Medicine

## 2023-02-01 DIAGNOSIS — R609 Edema, unspecified: Secondary | ICD-10-CM | POA: Insufficient documentation

## 2023-02-01 DIAGNOSIS — I119 Hypertensive heart disease without heart failure: Secondary | ICD-10-CM | POA: Insufficient documentation

## 2023-02-01 DIAGNOSIS — M7989 Other specified soft tissue disorders: Secondary | ICD-10-CM | POA: Insufficient documentation

## 2023-02-01 DIAGNOSIS — I340 Nonrheumatic mitral (valve) insufficiency: Secondary | ICD-10-CM

## 2023-02-01 DIAGNOSIS — R601 Generalized edema: Secondary | ICD-10-CM

## 2023-02-01 LAB — ECHOCARDIOGRAM COMPLETE
Area-P 1/2: 3.48 cm2
Calc EF: 55.6 %
S' Lateral: 4.1 cm
Single Plane A2C EF: 58.5 %
Single Plane A4C EF: 57.4 %

## 2023-02-01 NOTE — Progress Notes (Signed)
Echocardiogram 2D Echocardiogram has been performed.  Augustine Radar 02/01/2023, 8:39 AM

## 2023-02-08 ENCOUNTER — Ambulatory Visit: Payer: Commercial Managed Care - HMO | Admitting: Family Medicine

## 2023-03-02 ENCOUNTER — Ambulatory Visit (INDEPENDENT_AMBULATORY_CARE_PROVIDER_SITE_OTHER): Payer: Commercial Managed Care - HMO | Admitting: Family Medicine

## 2023-03-02 ENCOUNTER — Encounter: Payer: Self-pay | Admitting: Family Medicine

## 2023-03-02 VITALS — BP 136/86 | HR 80 | Temp 97.0°F | Wt 360.6 lb

## 2023-03-02 DIAGNOSIS — R601 Generalized edema: Secondary | ICD-10-CM | POA: Diagnosis not present

## 2023-03-02 DIAGNOSIS — Z202 Contact with and (suspected) exposure to infections with a predominantly sexual mode of transmission: Secondary | ICD-10-CM | POA: Diagnosis not present

## 2023-03-02 MED ORDER — METRONIDAZOLE 500 MG PO TABS
500.0000 mg | ORAL_TABLET | Freq: Two times a day (BID) | ORAL | 0 refills | Status: AC
Start: 2023-03-02 — End: 2023-03-09

## 2023-03-02 NOTE — Patient Instructions (Addendum)
We are getting labs as discussed.  Take metronidazole as prescribed.

## 2023-03-02 NOTE — Progress Notes (Signed)
Assessment/Plan:   Problem List Items Addressed This Visit       Other   Generalized edema worse in lower extremities    The patient's leg swelling worsens throughout the day, not linked to heart failure according to previous echocardiogram and blood tests. The condition is likely due to a combination of medications and dependent edema.  Differential diagnosis:  Medication-induced edema Chronic venous insufficiency Lymphedema  Plan:  Compression stockings prescribed. Discussed about different strengths and availability. Continued cautious use of Lasix as needed, considering its side effects on urinary urgency. Potential trial of a different diuretic if symptoms persist.      Exposure to STD - Primary    The patient has a known exposure to trichomonas with no current symptoms of an active STD   Plan: Empirical treatment with metronidazole 500mg , twice daily for 7 days due to known exposure. Comprehensive STD testing including gonorrhea, chlamydia, trichomonas,, and syphilis, HIV, hepatitis B & C  Patient educated on symptoms to monitor for and advised to abstain from sexual activity until treatment is completed and a follow-up test confirms clearance.      Relevant Medications   metroNIDAZOLE (FLAGYL) 500 MG tablet   Other Relevant Orders   Urinalysis w microscopic + reflex cultur (Completed)   HCV Ab w Reflex to Quant PCR (Completed)   Hepatitis B surface antigen (Completed)   HIV Antibody (routine testing w rflx) (Completed)   RPR (Completed)   Chlamydia/Gonococcus/Trichomonas, NAA   Genital Mycoplasmas NAA, Urine (Completed)    There are no discontinued medications.  Return if symptoms worsen or fail to improve, for STD.    Subjective:   Encounter date: 03/02/2023  Hunter Huynh is a 42 y.o. male who has Adrenal insufficiency (HCC); Central hypothyroidism; Panhypopituitarism (HCC); Diabetes insipidus (HCC); Primary hypertension; Brain tumor (HCC);  Testosterone deficiency; History of pulmonary embolus (PE); Swelling of both hands; Family history of bipolar disorder; Generalized edema worse in lower extremities; Concern about mental disorder without diagnosis; and Exposure to STD on their problem list..   He  has a past medical history of History of brain surgery (02/2022), History of streptococcal sore throat, and Hypertension..   CHIEF COMPLAINT: Follow-up for STD testing and leg swelling.  HISTORY OF PRESENT ILLNESS:  STD Testing. The patient is here for a follow-up regarding STD testing. The patient reports no symptoms such as penile discharge or lesions. He denies having fevers, nausea, or vomiting. Patient has a known exposure to trichomonas. It was discussed that testing can be done via urine or penile swab, though empirical treatment with metronidazole was recommended.  Leg Swelling. Patient reports that leg swelling is more pronounced by bedtime compared to the morning. Previous echocardiogram and blood work ruled out heart failure. He has had ultrasounds suggesting dependent edema. The patient believes the combination of medications may be contributing. Amlodipine was previously stopped. Lasix was of limited help, he noted difficulty using it due to its diuretic effects, especially while driving.    Review of Systems  Constitutional:  Negative for chills, diaphoresis, fever, malaise/fatigue and weight loss.  HENT:  Negative for congestion, ear discharge, ear pain and hearing loss.   Eyes:  Negative for blurred vision, double vision, photophobia, pain, discharge and redness.  Respiratory:  Negative for cough, sputum production, shortness of breath and wheezing.   Cardiovascular:  Positive for leg swelling. Negative for chest pain, palpitations, orthopnea and PND.  Gastrointestinal:  Negative for abdominal pain, blood in stool, constipation, diarrhea, heartburn,  melena, nausea and vomiting.  Genitourinary:  Positive for frequency.  Negative for dysuria, flank pain, hematuria and urgency.  Musculoskeletal:  Negative for myalgias.  Skin:  Negative for itching and rash.  Neurological:  Negative for dizziness, tingling, tremors, speech change, seizures, loss of consciousness, weakness and headaches.  Endo/Heme/Allergies:  Negative for polydipsia.  Psychiatric/Behavioral:  Negative for depression, hallucinations, memory loss, substance abuse and suicidal ideas. The patient does not have insomnia.   All other systems reviewed and are negative.   Past Surgical History:  Procedure Laterality Date   brain tumor removed  02/2022    Outpatient Medications Prior to Visit  Medication Sig Dispense Refill   desmopressin (DDAVP NASAL) 0.01 % solution Place into the nose.     hydrocortisone (CORTEF) 20 MG tablet Take 20 mg by mouth daily.     levothyroxine (SYNTHROID) 150 MCG tablet Take 150 mcg by mouth every morning.     OLMESARTAN MEDOXOMIL PO 40 mg.     omeprazole (PRILOSEC) 20 MG capsule Take 1 capsule by mouth daily.     Elastic Bandages & Supports (MEDICAL COMPRESSION STOCKINGS) MISC 1 each by Does not apply route as needed (Leg swelling). (Patient not taking: Reported on 03/02/2023) 1 each 0   furosemide (LASIX) 20 MG tablet Take 1 tablet (20 mg total) by mouth every 6 (six) hours as needed for edema. 90 tablet 0   No facility-administered medications prior to visit.    Family History  Problem Relation Age of Onset   Bipolar disorder Sister     Social History   Socioeconomic History   Marital status: Married    Spouse name: Not on file   Number of children: Not on file   Years of education: Not on file   Highest education level: 12th grade  Occupational History   Not on file  Tobacco Use   Smoking status: Former    Packs/day: .5    Types: Cigarettes    Passive exposure: Never   Smokeless tobacco: Never   Tobacco comments:    Vaping 3 days a week   Vaping Use   Vaping Use: Some days  Substance and  Sexual Activity   Alcohol use: Not Currently   Drug use: Yes    Types: Marijuana   Sexual activity: Not on file  Other Topics Concern   Not on file  Social History Narrative   Not on file   Social Determinants of Health   Financial Resource Strain: Low Risk  (01/11/2023)   Overall Financial Resource Strain (CARDIA)    Difficulty of Paying Living Expenses: Not hard at all  Food Insecurity: No Food Insecurity (01/11/2023)   Hunger Vital Sign    Worried About Running Out of Food in the Last Year: Never true    Ran Out of Food in the Last Year: Never true  Transportation Needs: No Transportation Needs (01/11/2023)   PRAPARE - Administrator, Civil Service (Medical): No    Lack of Transportation (Non-Medical): No  Physical Activity: Insufficiently Active (01/11/2023)   Exercise Vital Sign    Days of Exercise per Week: 1 day    Minutes of Exercise per Session: 10 min  Stress: No Stress Concern Present (01/11/2023)   [employer] of Occupational Health - Occupational Stress Questionnaire    Feeling of Stress : Only a little  Social Connections: Socially Integrated (01/11/2023)   Social Connection and Isolation Panel [NHANES]    Frequency of Communication with Friends and Family:  More than three times a week    Frequency of Social Gatherings with Friends and Family: Three times a week    Attends Religious Services: More than 4 times per year    Active Member of Clubs or Organizations: Yes    Attends Engineer, structural: More than 4 times per year    Marital Status: Married  Catering manager Violence: Not on file                                                                                                  Objective:  Physical Exam: BP 136/86 (BP Location: Left Arm, Patient Position: Sitting, Cuff Size: Large)   Pulse 80   Temp (!) 97 F (36.1 C) (Temporal)   Wt (!) 360 lb 9.6 oz (163.6 kg)   SpO2 98%   BMI 47.58 kg/m     Physical  Exam Constitutional:      Appearance: Normal appearance.  HENT:     Head: Normocephalic and atraumatic.     Right Ear: Hearing normal.     Left Ear: Hearing normal.     Nose: Nose normal.  Eyes:     General: No scleral icterus.       Right eye: No discharge.        Left eye: No discharge.     Extraocular Movements: Extraocular movements intact.  Cardiovascular:     Rate and Rhythm: Normal rate and regular rhythm.     Heart sounds: Normal heart sounds.  Pulmonary:     Effort: Pulmonary effort is normal.     Breath sounds: Normal breath sounds.  Abdominal:     Palpations: Abdomen is soft. There is no shifting dullness or fluid wave.     Tenderness: There is no abdominal tenderness.  Musculoskeletal:     Right forearm: No swelling.     Left forearm: No swelling.     Right wrist: No swelling.     Left wrist: No swelling.     Right hand: No swelling.     Left hand: No swelling.     Right lower leg: 2+ Pitting Edema present.     Left lower leg: 2+ Pitting Edema present.  Skin:    General: Skin is warm.     Findings: No rash.  Neurological:     General: No focal deficit present.     Mental Status: He is alert.     Cranial Nerves: No cranial nerve deficit.  Psychiatric:        Mood and Affect: Mood normal.        Behavior: Behavior normal.        Thought Content: Thought content normal.        Judgment: Judgment normal.     ECHOCARDIOGRAM COMPLETE  Result Date: 02/01/2023    ECHOCARDIOGRAM REPORT   Patient Name:   DEONDRE MEN Date of Exam: 02/01/2023 Medical Rec #:  [id number]       Height:       73.0 in Accession #:    [id number]      Weight:  361.2 lb Date of Birth:  [birth year]       BSA:          2.766 m Patient Age:    41 years        BP:           138/84 mmHg Patient Gender: M               HR:           68 bpm. Exam Location:  Outpatient Procedure: 2D Echo, 3D Echo, Cardiac Doppler, Color Doppler and Strain Analysis Indications:    Edema [782.3.ICD-9-CM]  History:         Patient has no prior history of Echocardiogram examinations.                 Signs/Symptoms:Edema; Risk Factors:Hypertension.  Sonographer:    Eulah Pont RDCS Referring Phys: [id number] Garnette Gunner  Sonographer Comments: Global longitudinal strain was attempted. IMPRESSIONS  1. Left ventricular ejection fraction, by estimation, is 55 to 60%. The left ventricle has normal function. The left ventricle has no regional wall motion abnormalities. Left ventricular diastolic parameters were normal.  2. Right ventricular systolic function is normal. The right ventricular size is mildly enlarged. There is normal pulmonary artery systolic pressure. The estimated right ventricular systolic pressure is 20.6 mmHg.  3. The mitral valve is normal in structure. Trivial mitral valve regurgitation.  4. The aortic valve is tricuspid. Aortic valve regurgitation is not visualized. No aortic stenosis is present.  5. The inferior vena cava is normal in size with greater than 50% respiratory variability, suggesting right atrial pressure of 3 mmHg. Comparison(s): No prior Echocardiogram. FINDINGS  Left Ventricle: Left ventricular ejection fraction, by estimation, is 55 to 60%. The left ventricle has normal function. The left ventricle has no regional wall motion abnormalities. Global longitudinal strain performed but not reported based on interpreter judgement due to suboptimal tracking. 3D ejection fraction reviewed and evaluated as part of the interpretation. Alternate measurement of EF is felt to be most reflective of LV function. The left ventricular internal cavity size was normal in  size. There is no left ventricular hypertrophy. Left ventricular diastolic parameters were normal. Right Ventricle: The right ventricular size is mildly enlarged. No increase in right ventricular wall thickness. Right ventricular systolic function is normal. There is normal pulmonary artery systolic pressure. The tricuspid regurgitant  velocity is 2.10  m/s, and with an assumed right atrial pressure of 3 mmHg, the estimated right ventricular systolic pressure is 20.6 mmHg. Left Atrium: Left atrial size was normal in size. Right Atrium: Right atrial size was normal in size. Pericardium: There is no evidence of pericardial effusion. Mitral Valve: The mitral valve is normal in structure. Trivial mitral valve regurgitation. Tricuspid Valve: The tricuspid valve is normal in structure. Tricuspid valve regurgitation is trivial. Aortic Valve: The aortic valve is tricuspid. Aortic valve regurgitation is not visualized. No aortic stenosis is present. Pulmonic Valve: The pulmonic valve was normal in structure. Pulmonic valve regurgitation is not visualized. Aorta: The aortic root is normal in size and structure. Venous: The inferior vena cava is normal in size with greater than 50% respiratory variability, suggesting right atrial pressure of 3 mmHg. IAS/Shunts: The atrial septum is grossly normal.  LEFT VENTRICLE PLAX 2D LVIDd:         5.80 cm      Diastology LVIDs:         4.10 cm      LV e' medial:  7.87 cm/s LV PW:         1.10 cm      LV E/e' medial:  8.4 LV IVS:        1.10 cm      LV e' lateral:   11.40 cm/s LVOT diam:     2.30 cm      LV E/e' lateral: 5.8 LV SV:         101 LV SV Index:   36 LVOT Area:     4.15 cm                              3D Volume EF: LV Volumes (MOD)            3D EF:        62 % LV vol d, MOD A2C: 195.0 ml LV EDV:       248 ml LV vol d, MOD A4C: 212.0 ml LV ESV:       95 ml LV vol s, MOD A2C: 81.0 ml  LV SV:        153 ml LV vol s, MOD A4C: 90.3 ml LV SV MOD A2C:     114.0 ml LV SV MOD A4C:     212.0 ml LV SV MOD BP:      115.3 ml RIGHT VENTRICLE RV S prime:     13.50 cm/s TAPSE (M-mode): 2.8 cm LEFT ATRIUM             Index        RIGHT ATRIUM           Index LA diam:        3.80 cm 1.37 cm/m   RA Area:     20.10 cm LA Vol (A2C):   58.4 ml 21.11 ml/m  RA Volume:   58.30 ml  21.08 ml/m LA Vol (A4C):   58.2 ml 21.04 ml/m  LA Biplane Vol: 62.0 ml 22.41 ml/m  AORTIC VALVE LVOT Vmax:   118.00 cm/s LVOT Vmean:  73.400 cm/s LVOT VTI:    0.242 m  AORTA Ao Root diam: 3.20 cm Ao Asc diam:  3.30 cm MITRAL VALVE               TRICUSPID VALVE MV Area (PHT): 3.48 cm    TR Peak grad:   17.6 mmHg MV Decel Time: 218 msec    TR Vmax:        210.00 cm/s MV E velocity: 65.80 cm/s MV A velocity: 48.50 cm/s  SHUNTS MV E/A ratio:  1.36        Systemic VTI:  0.24 m                            Systemic Diam: 2.30 cm Laurance Flatten MD Electronically signed by Laurance Flatten MD Signature Date/Time: 02/01/2023/9:47:47 AM    Final     Recent Results (from the past 2160 hour(s))  ECHOCARDIOGRAM COMPLETE     Status: None   Collection Time: 02/01/23  8:39 AM  Result Value Ref Range   S' Lateral 4.10 cm   Single Plane A4C EF 57.4 %   Single Plane A2C EF 58.5 %   Calc EF 55.6 %   Area-P 1/2 3.48 cm2   Est EF 55 - 60%   Urinalysis w microscopic + reflex cultur     Status: None   Collection  Time: 03/02/23  1:46 PM   Specimen: Urine  Result Value Ref Range   Color, Urine YELLOW YELLOW   APPearance CLEAR CLEAR   Specific Gravity, Urine 1.004 1.001 - 1.035   pH 7.0 5.0 - 8.0   Glucose, UA NEGATIVE NEGATIVE   Bilirubin Urine NEGATIVE NEGATIVE   Ketones, ur NEGATIVE NEGATIVE   Hgb urine dipstick NEGATIVE NEGATIVE   Protein, ur NEGATIVE NEGATIVE   Nitrites, Initial NEGATIVE NEGATIVE   Leukocyte Esterase NEGATIVE NEGATIVE   WBC, UA NONE SEEN 0 - 5 /HPF   RBC / HPF NONE SEEN 0 - 2 /HPF   Squamous Epithelial / HPF NONE SEEN < OR = 5 /HPF   Bacteria, UA NONE SEEN NONE SEEN /HPF   Hyaline Cast NONE SEEN NONE SEEN /LPF   Note      Comment: This urine was analyzed for the presence of WBC,  RBC, bacteria, casts, and other formed elements.  Only those elements seen were reported. . .   HCV Ab w Reflex to Quant PCR     Status: None   Collection Time: 03/02/23  1:46 PM  Result Value Ref Range   HCV Ab Non Reactive Non Reactive   Hepatitis B surface antigen     Status: None   Collection Time: 03/02/23  1:46 PM  Result Value Ref Range   Hepatitis B Surface Ag NON-REACTIVE NON-REACTIVE    Comment: . For additional information, please refer to  http://education.[facility].com/faq/FAQ202  (This link is being provided for informational/ educational purposes only.) .   HIV Antibody (routine testing w rflx)     Status: None   Collection Time: 03/02/23  1:46 PM  Result Value Ref Range   HIV 1&2 Ab, 4th Generation NON-REACTIVE NON-REACTIVE    Comment: HIV-1 antigen and HIV-1/HIV-2 antibodies were not detected. There is no laboratory evidence of HIV infection. Marland Kitchen PLEASE NOTE: This information has been disclosed to you from records whose confidentiality may be protected by state law.  If your state requires such protection, then the state law prohibits you from making any further disclosure of the information without the specific written consent of the person to whom it pertains, or as otherwise permitted by law. A general authorization for the release of medical or other information is NOT sufficient for this purpose. . For additional information please refer to http://education.[facility].com/faq/FAQ106 (This link is being provided for informational/ educational purposes only.) . Marland Kitchen The performance of this assay has not been clinically validated in patients less than 32 years old. .   RPR     Status: None   Collection Time: 03/02/23  1:46 PM  Result Value Ref Range   RPR Ser Ql NON-REACTIVE NON-REACTIVE    Comment: . No laboratory evidence of syphilis. If recent exposure is suspected, submit a new sample in 2-4 weeks. .   Genital Mycoplasmas NAA, Urine     Status: None (Preliminary result)   Collection Time: 03/02/23  1:46 PM  Result Value Ref Range   Mycoplasma genitalium NAA WILL FOLLOW    Mycoplasma hominis NAA Negative Negative   Ureaplasma spp NAA Negative Negative  REFLEXIVE URINE  CULTURE     Status: None   Collection Time: 03/02/23  1:46 PM  Result Value Ref Range   Reflexve Urine Culture      Comment: NO CULTURE INDICATED  Interpretation:     Status: None   Collection Time: 03/02/23  1:46 PM  Result Value Ref Range   HCV Interp 1:  Comment     Comment: Not infected with HCV unless early or acute infection is suspected (which may be delayed in an immunocompromised individual), or other evidence exists to indicate HCV infection.         Garner Nash, MD, MS

## 2023-03-03 LAB — URINALYSIS W MICROSCOPIC + REFLEX CULTURE
Bacteria, UA: NONE SEEN /HPF
Bilirubin Urine: NEGATIVE
Glucose, UA: NEGATIVE
Hgb urine dipstick: NEGATIVE
Hyaline Cast: NONE SEEN /LPF
Ketones, ur: NEGATIVE
Leukocyte Esterase: NEGATIVE
Nitrites, Initial: NEGATIVE
Protein, ur: NEGATIVE
RBC / HPF: NONE SEEN /HPF (ref 0–2)
Specific Gravity, Urine: 1.004 (ref 1.001–1.035)
Squamous Epithelial / HPF: NONE SEEN /HPF (ref <=5)
WBC, UA: NONE SEEN /HPF (ref 0–5)
pH: 7 (ref 5.0–8.0)

## 2023-03-03 LAB — NO CULTURE INDICATED

## 2023-03-03 LAB — HEPATITIS B SURFACE ANTIGEN: Hepatitis B Surface Ag: NONREACTIVE

## 2023-03-03 LAB — GENITAL MYCOPLASMAS NAA, URINE

## 2023-03-03 LAB — RPR: RPR Ser Ql: NONREACTIVE

## 2023-03-03 LAB — HIV ANTIBODY (ROUTINE TESTING W REFLEX): HIV 1&2 Ab, 4th Generation: NONREACTIVE

## 2023-03-04 DIAGNOSIS — Z202 Contact with and (suspected) exposure to infections with a predominantly sexual mode of transmission: Secondary | ICD-10-CM | POA: Insufficient documentation

## 2023-03-04 LAB — HCV INTERPRETATION

## 2023-03-04 NOTE — Assessment & Plan Note (Signed)
The patient's leg swelling worsens throughout the day, not linked to heart failure according to previous echocardiogram and blood tests. The condition is likely due to a combination of medications and dependent edema.  Differential diagnosis:  Medication-induced edema Chronic venous insufficiency Lymphedema  Plan:  Compression stockings prescribed. Discussed about different strengths and availability. Continued cautious use of Lasix as needed, considering its side effects on urinary urgency. Potential trial of a different diuretic if symptoms persist.

## 2023-03-04 NOTE — Assessment & Plan Note (Signed)
The patient has a known exposure to trichomonas with no current symptoms of an active STD   Plan: Empirical treatment with metronidazole 500mg , twice daily for 7 days due to known exposure. Comprehensive STD testing including gonorrhea, chlamydia, trichomonas,, and syphilis, HIV, hepatitis B & C  Patient educated on symptoms to monitor for and advised to abstain from sexual activity until treatment is completed and a follow-up test confirms clearance.

## 2023-03-05 LAB — CHLAMYDIA/GONOCOCCUS/TRICHOMONAS, NAA
Chlamydia by NAA: NEGATIVE
Gonococcus by NAA: NEGATIVE
Trich vag by NAA: NEGATIVE

## 2023-03-05 LAB — GENITAL MYCOPLASMAS NAA, URINE
Mycoplasma hominis NAA: NEGATIVE
Ureaplasma spp NAA: NEGATIVE

## 2023-03-05 LAB — HCV AB W REFLEX TO QUANT PCR: HCV Ab: NONREACTIVE

## 2023-03-16 ENCOUNTER — Ambulatory Visit: Payer: Commercial Managed Care - HMO | Admitting: Family Medicine

## 2023-04-12 ENCOUNTER — Ambulatory Visit: Payer: Commercial Managed Care - HMO | Admitting: Family Medicine

## 2023-08-02 ENCOUNTER — Ambulatory Visit: Payer: Managed Care, Other (non HMO) | Admitting: Family Medicine

## 2023-08-02 ENCOUNTER — Encounter: Payer: Self-pay | Admitting: Family Medicine

## 2023-08-02 VITALS — BP 135/82 | HR 85 | Temp 95.8°F | Wt 353.0 lb

## 2023-08-02 DIAGNOSIS — Z6841 Body Mass Index (BMI) 40.0 and over, adult: Secondary | ICD-10-CM

## 2023-08-02 DIAGNOSIS — E232 Diabetes insipidus: Secondary | ICD-10-CM | POA: Diagnosis not present

## 2023-08-02 DIAGNOSIS — E349 Endocrine disorder, unspecified: Secondary | ICD-10-CM

## 2023-08-02 DIAGNOSIS — I10 Essential (primary) hypertension: Secondary | ICD-10-CM

## 2023-08-02 DIAGNOSIS — R399 Unspecified symptoms and signs involving the genitourinary system: Secondary | ICD-10-CM | POA: Diagnosis not present

## 2023-08-02 DIAGNOSIS — E66813 Obesity, class 3: Secondary | ICD-10-CM

## 2023-08-02 DIAGNOSIS — N521 Erectile dysfunction due to diseases classified elsewhere: Secondary | ICD-10-CM

## 2023-08-02 MED ORDER — OLMESARTAN MEDOXOMIL 40 MG PO TABS: 40.0000 mg | ORAL_TABLET | Freq: Every day | ORAL | 3 refills | Status: AC

## 2023-08-02 MED ORDER — TADALAFIL 5 MG PO TABS: 5.0000 mg | ORAL_TABLET | Freq: Every day | ORAL | 11 refills | Status: DC

## 2023-08-02 NOTE — Progress Notes (Signed)
Assessment/Plan:   Problem List Items Addressed This Visit       Cardiovascular and Mediastinum   Primary hypertension    Chronic Condition Status: Home BP stable  Plan:  Continue olmesartan 40?mg once daily; refill provided. Encourage home blood pressure monitoring. Reassess blood pressure at next visit.      Relevant Medications   tadalafil (CIALIS) 5 MG tablet   olmesartan (BENICAR) 40 MG tablet   Other Relevant Orders   Lipid Profile   Hemoglobin A1C   Comp Met (CMET)   Urinalysis w microscopic + reflex cultur   Microalbumin / creatinine urine ratio     Other   Diabetes insipidus (HCC)    Managed with desmopressin.  Without treatment patient developed swelling.  With treatment he has LUTS  Plan:  Continue desmopressin as directed. Follow-up care with endocrinology.      Relevant Medications   tadalafil (CIALIS) 5 MG tablet   Testosterone deficiency    On testosterone cypionate 200?mg IM every 10 days; experiencing improved libido.  Plan:  Continue testosterone therapy. Monitor levels as per endocrinology.      Class 3 severe obesity with serious comorbidity and body mass index (BMI) of 45.0 to 49.9 in adult [facility])    Ongoing.  Plan:  Discuss weight management strategies in future visits. Consider referral to dietitian or weight management program.      Lower urinary tract symptoms (LUTS)    Uncontrolled.  Symptoms include urinary hesitancy, frequency and nocturia.  Significantly influenced by desmopressin  Plan: Urinalysis to rule out infection Hemoglobin A1c to rule out diabetes mellitus Assess prostate with PSA Tadalafil 5 mg daily Follow-up 1 month      Relevant Orders   Lipid Profile   Hemoglobin A1C   Comp Met (CMET)   PSA   Urinalysis w microscopic + reflex cultur   Erectile dysfunction due to diseases classified elsewhere - Primary    Worsening erectile dysfunction characterized by inability to achieve or maintain erections  despite increased libido  Differential Diagnosis: Likely multifactorial including Hormonal deficiency secondary to hypopituitarism including hypogonadism, hypothyroidism Vascular erectile dysfunction due to hypertension and hyperlipidemia.  Plan:  Initiate tadalafil 5?mg orally once daily to address erectile dysfunction and improve urinary symptoms. Continue hormone replacement therapy as prescribed by endocrinology. Order laboratory tests: fasting lipid panel, hemoglobin A1c, comprehensive metabolic panel to assess contributing factors. Consider urology referral if no improvement after one month. Monitor for medication side effects.       Medications Discontinued During This Encounter  Medication Reason   furosemide (LASIX) 20 MG tablet    OLMESARTAN MEDOXOMIL PO Reorder    Return in about 4 weeks (around 08/30/2023) for ED, fasting labs within 1 month of today's appointment.    Subjective:   Encounter date: 08/02/2023  Hunter Huynh is a 42 y.o. male who has Adrenal insufficiency (HCC); Central hypothyroidism; Panhypopituitarism (HCC); Diabetes insipidus (HCC); Primary hypertension; Brain tumor (HCC); Testosterone deficiency; History of pulmonary embolus (PE); Swelling of both hands; Family history of bipolar disorder; Generalized edema worse in lower extremities; Concern about mental disorder without diagnosis; Exposure to STD; Class 3 severe obesity with serious comorbidity and body mass index (BMI) of 45.0 to 49.9 in adult [facility]); Lower urinary tract symptoms (LUTS); and Erectile dysfunction due to diseases classified elsewhere on their problem list..   He  has a past medical history of History of brain surgery (02/2022), History of streptococcal sore throat, and Hypertension..   Chief Complaint: Erectile  dysfunction.  History of Present Illness:  Patient with a history of panhypopituitarism including adrenal insufficiency, hypothyroidism, diabetes insipidus, testosterone  deficiency along with history of hypertension, hyperlipidemia, and morbid obesity presenting with concerns of worsening erectile dysfunction. Initially able to achieve erection but unable to maintain it; currently unable to achieve erection despite increased libido over the past 3-4 months, attributed to testosterone replacement therapy (testosterone cypionate 200?mg IM every 10 days). Patient denies urinary hesitancy or dysuria but reports frequent nighttime urination associated with diabetes insipidus. Occasionally skips desmopressin nasal spray at night to reduce knee swelling. Home blood pressure readings average 135/82?mmHg. In-office blood pressure was 142/84?mmHg. Denies chest pain, shortness of breath, or mood disturbances; experiencing stress due to car troubles.  Review of Systems  Constitutional:  Negative for chills, diaphoresis, fever, malaise/fatigue and weight loss.  HENT:  Negative for congestion, ear discharge, ear pain and hearing loss.   Eyes:  Negative for blurred vision, double vision, photophobia, pain, discharge and redness.  Respiratory:  Negative for cough, sputum production, shortness of breath and wheezing.   Cardiovascular:  Positive for leg swelling (bilateral knees). Negative for chest pain, palpitations, orthopnea and PND.  Gastrointestinal:  Negative for abdominal pain, blood in stool, constipation, diarrhea, heartburn, melena, nausea and vomiting.  Genitourinary:  Positive for frequency and urgency. Negative for dysuria, flank pain and hematuria.       Nocturia  Musculoskeletal:  Negative for myalgias.  Skin:  Negative for itching and rash.  Neurological:  Negative for dizziness, tingling, tremors, speech change, seizures, loss of consciousness, weakness and headaches.  Endo/Heme/Allergies:  Positive for polydipsia.  Psychiatric/Behavioral:  Negative for depression, hallucinations, memory loss, substance abuse and suicidal ideas. The patient is nervous/anxious (Stress  about car issues). The patient does not have insomnia.        Normal libido  All other systems reviewed and are negative.     01/11/2023   11:19 AM 11/25/2022   10:03 AM 04/05/2022    8:51 AM  Depression screen PHQ 2/9  Decreased Interest 0 1 0  Down, Depressed, Hopeless 0 0 0  PHQ - 2 Score 0 1 0  Altered sleeping 0 0   Tired, decreased energy 1 2   Change in appetite 0 1   Feeling bad or failure about yourself  0 1   Trouble concentrating 0 0   Moving slowly or fidgety/restless 0 0   Suicidal thoughts 0 0   PHQ-9 Score 1 5   Difficult doing work/chores Not difficult at all Somewhat difficult       01/11/2023   11:19 AM 11/25/2022   10:03 AM  GAD 7 : Generalized Anxiety Score  Nervous, Anxious, on Edge 1 1  Control/stop worrying 1 1  Worry too much - different things 0 1  Trouble relaxing 0 0  Restless 0 0  Easily annoyed or irritable 0 2  Afraid - awful might happen 0 0  Total GAD 7 Score 2 5  Anxiety Difficulty Not difficult at all Somewhat difficult     Past Surgical History:  Procedure Laterality Date   brain tumor removed  02/2022    Outpatient Medications Prior to Visit  Medication Sig Dispense Refill   desmopressin (DDAVP NASAL) 0.01 % solution Place into the nose.     Elastic Bandages & Supports (MEDICAL COMPRESSION STOCKINGS) MISC 1 each by Does not apply route as needed (Leg swelling). 1 each 0   hydrocortisone (CORTEF) 20 MG tablet Take 20 mg by mouth  daily.     levothyroxine (SYNTHROID) 150 MCG tablet Take 150 mcg by mouth every morning.     omeprazole (PRILOSEC) 20 MG capsule Take 1 capsule by mouth daily.     testosterone cypionate (DEPOTESTOSTERONE CYPIONATE) 200 MG/ML injection Inject 200 mg into the muscle. 200 mg every 10 days     OLMESARTAN MEDOXOMIL PO 40 mg.     furosemide (LASIX) 20 MG tablet Take 1 tablet (20 mg total) by mouth every 6 (six) hours as needed for edema. 90 tablet 0   No facility-administered medications prior to visit.     Family History  Problem Relation Age of Onset   Bipolar disorder Sister     Social History   Socioeconomic History   Marital status: Married    Spouse name: Not on file   Number of children: Not on file   Years of education: Not on file   Highest education level: 12th grade  Occupational History   Not on file  Tobacco Use   Smoking status: Former    Current packs/day: 0.50    Types: Cigarettes    Passive exposure: Never   Smokeless tobacco: Never   Tobacco comments:    Vaping 3 days a week   Vaping Use   Vaping status: Some Days  Substance and Sexual Activity   Alcohol use: Not Currently   Drug use: Yes    Types: Marijuana   Sexual activity: Not on file  Other Topics Concern   Not on file  Social History Narrative   Not on file   Social Determinants of Health   Financial Resource Strain: Low Risk  (08/01/2023)   Overall Financial Resource Strain (CARDIA)    Difficulty of Paying Living Expenses: Not very hard  Food Insecurity: No Food Insecurity (08/01/2023)   Hunger Vital Sign    Worried About Running Out of Food in the Last Year: Never true    Ran Out of Food in the Last Year: Never true  Transportation Needs: No Transportation Needs (08/01/2023)   PRAPARE - Administrator, Civil Service (Medical): No    Lack of Transportation (Non-Medical): No  Physical Activity: Insufficiently Active (08/01/2023)   Exercise Vital Sign    Days of Exercise per Week: 1 day    Minutes of Exercise per Session: 20 min  Stress: No Stress Concern Present (08/01/2023)   [employer] of Occupational Health - Occupational Stress Questionnaire    Feeling of Stress : Only a little  Social Connections: Socially Integrated (08/01/2023)   Social Connection and Isolation Panel [NHANES]    Frequency of Communication with Friends and Family: More than three times a week    Frequency of Social Gatherings with Friends and Family: More than three times a week    Attends  Religious Services: More than 4 times per year    Active Member of [employer] or Organizations: Yes    Attends Engineer, structural: More than 4 times per year    Marital Status: Married  Catering manager Violence: Not on file  Objective:  Physical Exam: BP 135/82 Comment: home  Pulse 85   Temp (!) 95.8 F (35.4 C) (Temporal)   Wt (!) 353 lb (160.1 kg)   SpO2 99%   BMI 46.57 kg/m     Physical Exam Constitutional:      Appearance: Normal appearance.  HENT:     Head: Normocephalic and atraumatic.     Right Ear: Hearing normal.     Left Ear: Hearing normal.     Nose: Nose normal.  Eyes:     General: No scleral icterus.       Right eye: No discharge.        Left eye: No discharge.     Extraocular Movements: Extraocular movements intact.  Cardiovascular:     Rate and Rhythm: Normal rate and regular rhythm.     Heart sounds: Normal heart sounds.  Pulmonary:     Effort: Pulmonary effort is normal.     Breath sounds: Normal breath sounds.  Abdominal:     Palpations: Abdomen is soft.     Tenderness: There is no abdominal tenderness.  Skin:    General: Skin is warm.     Findings: No rash.  Neurological:     General: No focal deficit present.     Mental Status: He is alert.     Cranial Nerves: No cranial nerve deficit.  Psychiatric:        Mood and Affect: Mood normal.        Behavior: Behavior normal.        Thought Content: Thought content normal.        Judgment: Judgment normal.     No results found.  No results found for this or any previous visit (from the past 2160 hour(s)).      Garner Nash, MD, MS

## 2023-08-02 NOTE — Assessment & Plan Note (Addendum)
Managed with desmopressin.  Without treatment patient developed swelling.  With treatment he has LUTS  Plan:  Continue desmopressin as directed. Follow-up care with endocrinology.

## 2023-08-02 NOTE — Patient Instructions (Signed)
-   Begin taking tadalafil (Cialis) 5?mg every day as prescribed to help with erectile dysfunction and urinary symptoms. - Schedule a fasting blood test for cholesterol (lipid panel), hemoglobin A1C, urine, prostate, and metabolic panel before your next appointment. - Continue taking your medications as prescribed, including olmesartan for blood pressure and omeprazole for heartburn. - Monitor your blood pressure at home and keep a record of your readings. - Return for a follow-up appointment in one month to assess your progress and discuss how the medication is working.

## 2023-08-02 NOTE — Assessment & Plan Note (Signed)
Chronic Condition Status: Home BP stable  Plan:  Continue olmesartan 40?mg once daily; refill provided. Encourage home blood pressure monitoring. Reassess blood pressure at next visit.

## 2023-08-02 NOTE — Assessment & Plan Note (Signed)
Worsening erectile dysfunction characterized by inability to achieve or maintain erections despite increased libido  Differential Diagnosis: Likely multifactorial including Hormonal deficiency secondary to hypopituitarism including hypogonadism, hypothyroidism Vascular erectile dysfunction due to hypertension and hyperlipidemia.  Plan:  Initiate tadalafil 5?mg orally once daily to address erectile dysfunction and improve urinary symptoms. Continue hormone replacement therapy as prescribed by endocrinology. Order laboratory tests: fasting lipid panel, hemoglobin A1c, comprehensive metabolic panel to assess contributing factors. Consider urology referral if no improvement after one month. Monitor for medication side effects.

## 2023-08-02 NOTE — Assessment & Plan Note (Signed)
On testosterone cypionate 200?mg IM every 10 days; experiencing improved libido.  Plan:  Continue testosterone therapy. Monitor levels as per endocrinology.

## 2023-08-02 NOTE — Assessment & Plan Note (Signed)
Ongoing.  Plan:  Discuss weight management strategies in future visits. Consider referral to dietitian or weight management program.

## 2023-08-02 NOTE — Assessment & Plan Note (Addendum)
Uncontrolled.  Symptoms include urinary hesitancy, frequency and nocturia.  Significantly influenced by desmopressin  Plan: Urinalysis to rule out infection Hemoglobin A1c to rule out diabetes mellitus Assess prostate with PSA Tadalafil 5 mg daily Follow-up 1 month

## 2023-08-26 ENCOUNTER — Other Ambulatory Visit: Payer: Self-pay

## 2023-08-26 ENCOUNTER — Encounter (HOSPITAL_COMMUNITY): Payer: Self-pay

## 2023-08-26 ENCOUNTER — Emergency Department (HOSPITAL_COMMUNITY)
Admission: EM | Admit: 2023-08-26 | Discharge: 2023-08-26 | Disposition: A | Discharge status: Home / Self Care | Payer: Commercial Managed Care - HMO | Attending: Student | Admitting: Student

## 2023-08-26 DIAGNOSIS — M5440 Lumbago with sciatica, unspecified side: Secondary | ICD-10-CM | POA: Insufficient documentation

## 2023-08-26 DIAGNOSIS — Z79899 Other long term (current) drug therapy: Secondary | ICD-10-CM | POA: Insufficient documentation

## 2023-08-26 DIAGNOSIS — I10 Essential (primary) hypertension: Secondary | ICD-10-CM | POA: Insufficient documentation

## 2023-08-26 DIAGNOSIS — M5431 Sciatica, right side: Secondary | ICD-10-CM

## 2023-08-26 MED ORDER — KETOROLAC TROMETHAMINE 15 MG/ML IJ SOLN
15.0000 mg | Freq: Once | INTRAMUSCULAR | Status: AC
  Administered 2023-08-26: 15 mg via INTRAMUSCULAR
  Filled 2023-08-26: qty 1

## 2023-08-26 MED ORDER — ACETAMINOPHEN 500 MG PO TABS: 1000.0000 mg | ORAL_TABLET | Freq: Three times a day (TID) | ORAL | 0 refills | Status: AC

## 2023-08-26 MED ORDER — DEXAMETHASONE SODIUM PHOSPHATE 10 MG/ML IJ SOLN
8.0000 mg | Freq: Once | INTRAMUSCULAR | Status: AC
  Administered 2023-08-26: 8 mg via INTRAMUSCULAR
  Filled 2023-08-26: qty 1

## 2023-08-26 MED ORDER — NAPROXEN 375 MG PO TABS: 375.0000 mg | ORAL_TABLET | Freq: Two times a day (BID) | ORAL | 0 refills | Status: DC

## 2023-08-26 MED ORDER — METHYLPREDNISOLONE 4 MG PO TBPK: ORAL_TABLET | ORAL | 0 refills | Status: DC

## 2023-08-26 MED ORDER — LIDOCAINE 5 % EX PTCH
1.0000 | MEDICATED_PATCH | CUTANEOUS | Status: DC
  Administered 2023-08-26: 1 via TRANSDERMAL
  Filled 2023-08-26: qty 1

## 2023-08-26 NOTE — ED Triage Notes (Signed)
Patient reports right sciatic nerve pain x 2 weeks. Has had same pain in the past. Has not taken anything for the pain.

## 2023-08-26 NOTE — Discharge Instructions (Signed)
For pain:  - Acetaminophen 1000 mg three times daily (every 8 hours) - Naproxen 2 times daily (every 12 hours) - lidoderm patches twice daily 

## 2023-08-26 NOTE — ED Provider Notes (Signed)
[place] EMERGENCY DEPARTMENT AT [facility] Provider Note  CSN: [id number] Arrival date & time: 08/26/23 1191  Chief Complaint(s) Sciatica  HPI Hunter Huynh is a 42 y.o. male with PMH pituitary tumor status post surgical removal now with panhypopituitarism and diabetes insipidus, sciatica who presents emergency department for evaluation of sciatic pain.  States that he received injections in his back over 10 years ago and has not had a flareup for many years.  However, over the last 2 weeks his sciatic pain has been worsening start in the back and shooting down the legs.  Denies saddle anesthesia, loss of bowel or bladder, numbness, tingling, weakness or any other red flag signs of back pain.  Denies fever, chest pain, shortness of breath, abdominal pain, nausea, vomiting or other systemic symptoms.   Past Medical History Past Medical History:  Diagnosis Date   History of brain surgery 02/2022   History of streptococcal sore throat    Hypertension    Patient Active Problem List   Diagnosis Date Noted   Class 3 severe obesity with serious comorbidity and body mass index (BMI) of 45.0 to 49.9 in adult [facility]) 08/02/2023   Lower urinary tract symptoms (LUTS) 08/02/2023   Erectile dysfunction due to diseases classified elsewhere 08/02/2023   Exposure to STD 03/04/2023   Testosterone deficiency 11/25/2022   History of pulmonary embolus (PE) 11/25/2022   Swelling of both hands 11/25/2022   Family history of bipolar disorder 11/25/2022   Generalized edema worse in lower extremities 11/25/2022   Concern about mental disorder without diagnosis 11/25/2022   Brain tumor (HCC) 04/05/2022   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/02/2022   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   Diabetes insipidus (HCC) 02/13/2022   Primary hypertension 01/15/2022   Home Medication(s) Prior to Admission medications   Medication Sig Start Date End Date Taking?  Authorizing Provider  desmopressin (DDAVP NASAL) 0.01 % solution Place into the nose. 04/02/22   [provider]  Elastic Bandages & Supports (MEDICAL COMPRESSION STOCKINGS) MISC 1 each by Does not apply route as needed (Leg swelling). 01/11/23   Garnette Gunner, MD  hydrocortisone (CORTEF) 20 MG tablet Take 20 mg by mouth daily. 03/26/22   [provider]  levothyroxine (SYNTHROID) 150 MCG tablet Take 150 mcg by mouth every morning. 03/27/22   [provider]  olmesartan (BENICAR) 40 MG tablet Take 1 tablet (40 mg total) by mouth daily. 08/02/23 07/27/24  Garnette Gunner, MD  omeprazole (PRILOSEC) 20 MG capsule Take 1 capsule by mouth daily.    [provider]  tadalafil (CIALIS) 5 MG tablet Take 1 tablet (5 mg total) by mouth daily. 08/02/23   Garnette Gunner, MD  testosterone cypionate (DEPOTESTOSTERONE CYPIONATE) 200 MG/ML injection Inject 200 mg into the muscle. 200 mg every 10 days    [provider]  Past Surgical History Past Surgical History:  Procedure Laterality Date   brain tumor removed  02/2022   Family History Family History  Problem Relation Age of Onset   Bipolar disorder Sister     Social History Social History   Tobacco Use   Smoking status: Former    Current packs/day: 0.50    Types: Cigarettes    Passive exposure: Never   Smokeless tobacco: Never   Tobacco comments:    Vaping 3 days a week   Vaping Use   Vaping status: Some Days  Substance Use Topics   Alcohol use: Not Currently   Drug use: Yes    Types: Marijuana   Allergies Patient has no known allergies.  Review of Systems Review of Systems  Musculoskeletal:  Positive for back pain.    Physical Exam Vital Signs  I have reviewed the triage vital signs BP (!) 176/113 (BP Location: Right Arm)   Pulse 88   Temp 97.6 F (36.4  C) (Oral)   Resp 18   Ht 6\' 3"  (1.905 m)   Wt (!) 158.8 kg   SpO2 94%   BMI 43.75 kg/m   Physical Exam Constitutional:      General: He is not in acute distress.    Appearance: Normal appearance.  HENT:     Head: Normocephalic and atraumatic.     Nose: No congestion or rhinorrhea.  Eyes:     General:        Right eye: No discharge.        Left eye: No discharge.     Extraocular Movements: Extraocular movements intact.     Pupils: Pupils are equal, round, and reactive to light.  Cardiovascular:     Rate and Rhythm: Normal rate and regular rhythm.     Heart sounds: No murmur heard. Pulmonary:     Effort: No respiratory distress.     Breath sounds: No wheezing or rales.  Abdominal:     General: There is no distension.     Tenderness: There is no abdominal tenderness.  Musculoskeletal:        General: Tenderness present. Normal range of motion.     Cervical back: Normal range of motion.  Skin:    General: Skin is warm and dry.  Neurological:     General: No focal deficit present.     Mental Status: He is alert.     ED Results and Treatments Labs (all labs ordered are listed, but only abnormal results are displayed) Labs Reviewed - No data to display                                                                                                                        Radiology No results found.  Pertinent labs & imaging results that were available during my care of the patient were reviewed by me and considered in my medical decision making (see MDM for details).  Medications Ordered in ED Medications  lidocaine (LIDODERM)  5 % 1 patch (1 patch Transdermal Patch Applied 08/26/23 0914)  ketorolac (TORADOL) 15 MG/ML injection 15 mg (15 mg Intramuscular Given 08/26/23 0913)  dexamethasone (DECADRON) injection 8 mg (8 mg Intramuscular Given 08/26/23 0914)                                                                                                                                      Procedures Procedures  (including critical care time)  Medical Decision Making / ED Course   This patient presents to the ED for concern of back pain, this involves an extensive number of treatment options, and is a complaint that carries with it a high risk of complications and morbidity.  The differential diagnosis includes muscular strain/spasm, lumbago, disc herniation, spinal fracture, cauda equina, epidural abscess or hematoma, psoas abscess, pyelonephritis  MDM: Patient seen emergency room for evaluation of back pain with sciatica.  Physical exam with mild L-spine tenderness to palpation but is otherwise unremarkable.  Neurologic exam unremarkable with no focal motor or sensory deficits.  No saddle anesthesia.  Patient pain controlled with Toradol and Decadron and a Lidoderm patch and on reevaluation patient's symptoms have significant proved.  With known history of sciatica and patient not taking any medications at home for sciatica, this is likely a flare of his known sciatica, lower suspicion for epidural abscess, spinal cord compression or other life-threatening pathology.  Additionally, in the setting of a normal neurologic exam, advanced imaging deferred.  Outpatient neurosurgical referral placed and patient started on a pain regimen with Tylenol, Naprosyn and Lidoderm.  Medrol Dosepak sent.  At this time he does not meet inpatient criteria for admission and will be discharged with outpatient follow-up.  Return precautions given which he voiced understanding   Additional history obtained:  -External records from outside source obtained and reviewed including: Chart review including previous notes, labs, imaging, consultation notes   Medicines ordered and prescription drug management: Meds ordered this encounter  Medications   ketorolac (TORADOL) 15 MG/ML injection 15 mg   lidocaine (LIDODERM) 5 % 1 patch   dexamethasone (DECADRON) injection 8 mg    -I have  reviewed the patients home medicines and have made adjustments as needed  Critical interventions none   Social Determinants of Health:  Factors impacting patients care include: none   Reevaluation: After the interventions noted above, I reevaluated the patient and found that they have :improved  Co morbidities that complicate the patient evaluation  Past Medical History:  Diagnosis Date   History of brain surgery 02/2022   History of streptococcal sore throat    Hypertension       Dispostion: I considered admission for this patient, but at this time he does not meet inpatient criteria for admission and will be discharged with outpatient follow-up     Final Clinical Impression(s) / ED Diagnoses Final diagnoses:  None     @PCDICTATION @  Glendora Score, MD 08/26/23 [phone]

## 2023-08-29 ENCOUNTER — Telehealth: Payer: Self-pay

## 2023-08-29 NOTE — Transitions of Care (Post Inpatient/ED Visit) (Unsigned)
   08/29/2023  Name: DARREK ROHLIK MRN: [id number] DOB: 07/08/1981  Today's TOC FU Call Status: Today's TOC FU Call Status:: Unsuccessful Call (1st Attempt) Unsuccessful Call (1st Attempt) Date: 08/29/23  Attempted to reach the patient regarding the most recent Inpatient/ED visit.  Follow Up Plan: Additional outreach attempts will be made to reach the patient to complete the Transitions of Care (Post Inpatient/ED visit) call.   Signature Arvil Persons, BSN, Charity fundraiser

## 2023-08-31 NOTE — Transitions of Care (Post Inpatient/ED Visit) (Signed)
   08/31/2023  Name: JYE MOOS MRN: [id number] DOB: [birth year]  Today's TOC FU Call Status: Today's TOC FU Call Status:: Successful TOC FU Call Completed Unsuccessful Call (1st Attempt) Date: 08/29/23 [facility] FU Call Complete Date: 08/31/23 Patient's Name and Date of Birth confirmed.  Transition Care Management Follow-up Telephone Call Date of Discharge: 08/26/23 Discharge Facility: [facility]) Type of Discharge: Emergency Department Reason for ED Visit: Other: (sciatica, bilateral) How have you been since you were released from the hospital?: Better Any questions or concerns?: Yes Patient Questions/Concerns:: Pt would like a referral to neuro. Will discuss at appt otmorrow 09/01/23.  Items Reviewed: Did you receive and understand the discharge instructions provided?: Yes Any new allergies since your discharge?: No Dietary orders reviewed?: NA Do you have support at home?: Yes  Medications Reviewed Today: Medications Reviewed Today   Medications were not reviewed in this encounter     Home Care and Equipment/Supplies: Were Home Health Services Ordered?: NA Any new equipment or medical supplies ordered?: NA  Functional Questionnaire: Do you need assistance with bathing/showering or dressing?: No Do you need assistance with meal preparation?: No Do you need assistance with eating?: No Do you have difficulty maintaining continence: No Do you need assistance with getting out of bed/getting out of a chair/moving?: No Do you have difficulty managing or taking your medications?: No  Follow up appointments reviewed: PCP Follow-up appointment confirmed?: Yes Date of PCP follow-up appointment?: 09/01/23 Follow-up Provider: [facility] Follow-up appointment confirmed?: NA Do you need transportation to your follow-up appointment?: No Do you understand care options if your condition(s) worsen?: Yes-patient verbalized understanding    SIGNATURE Arvil Persons, BSN, RN

## 2023-09-01 ENCOUNTER — Ambulatory Visit (INDEPENDENT_AMBULATORY_CARE_PROVIDER_SITE_OTHER): Payer: Commercial Managed Care - HMO | Admitting: Family Medicine

## 2023-09-01 VITALS — BP 138/86 | HR 80 | Temp 97.5°F | Wt 352.4 lb

## 2023-09-01 DIAGNOSIS — I10 Essential (primary) hypertension: Secondary | ICD-10-CM

## 2023-09-01 DIAGNOSIS — G8929 Other chronic pain: Secondary | ICD-10-CM

## 2023-09-01 DIAGNOSIS — N521 Erectile dysfunction due to diseases classified elsewhere: Secondary | ICD-10-CM | POA: Diagnosis not present

## 2023-09-01 DIAGNOSIS — R399 Unspecified symptoms and signs involving the genitourinary system: Secondary | ICD-10-CM

## 2023-09-01 DIAGNOSIS — M5441 Lumbago with sciatica, right side: Secondary | ICD-10-CM | POA: Diagnosis not present

## 2023-09-01 NOTE — Progress Notes (Signed)
Assessment/Plan:   Problem List Items Addressed This Visit       Cardiovascular and Mediastinum   Primary hypertension     Nervous and Auditory   Chronic low back pain with right-sided sciatica - Primary   Status post Medrol course pack.  Improved.    Plan: Continue NSAIDs. Referral to orthopedics for further assessment management      Relevant Orders   Ambulatory referral to Orthopedic Surgery     Other   Lower urinary tract symptoms (LUTS)   Erectile dysfunction due to diseases classified elsewhere   Rectal dysfunction lower urinary tract symptoms improved on tadalafil 5 mg daily  Continue current therapy follow-up       There are no discontinued medications.  Return in about 6 months (around 03/01/2024) for BP.    Subjective:   Encounter date: 09/01/2023  Hunter Huynh is a 42 y.o. male who has Adrenal insufficiency (HCC); Central hypothyroidism; Panhypopituitarism (HCC); Diabetes insipidus (HCC); Primary hypertension; Brain tumor (HCC); Testosterone deficiency; History of pulmonary embolus (PE); Swelling of both hands; Family history of bipolar disorder; Generalized edema worse in lower extremities; Concern about mental disorder without diagnosis; Exposure to STD; Class 3 severe obesity with serious comorbidity and body mass index (BMI) of 45.0 to 49.9 in adult [facility]); Lower urinary tract symptoms (LUTS); Erectile dysfunction due to diseases classified elsewhere; and Chronic low back pain with right-sided sciatica on their problem list..   He  has a past medical history of History of brain surgery (02/2022), History of streptococcal sore throat, and Hypertension..   Chief Complaint: Erectile dysfunction; right-sided sciatica.  History of Present Illness:  Patient is a 42 year old individual presenting with concerns of erectile dysfunction improvement and recurrence of right-sided sciatica.  Erectile Dysfunction: Reports significant improvement with tadalafil  5?mg daily; increased libido and satisfactory sexual activity. Denies urinary hesitancy or dysuria. Notes decreased nighttime urination associated with diabetes insipidus management.  Sciatica: Recurrence of right-sided sciatica began approximately three weeks ago, initially mild. 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 Visited emergency department on 08/26/2023 due to severe pain; received methylprednisolone (Medrol dose pack), naproxen, and lidocaine patches. Completed Medrol dose pack; continues naproxen as needed. Current pain rated 1-2/10, worsens when lying down, improves with sitting and heat application. Reports numbness in right leg. Denies bowel or bladder incontinence.  Review of Systems:  Constitutional: Positive for obesity; otherwise negative. Cardiovascular: Denies chest pain, palpitations. Respiratory: Denies shortness of breath. Gastrointestinal: Denies abdominal pain. Genitourinary: Reports improved erectile function; decreased nighttime urination; denies urinary hesitancy or dysuria. Endocrine: Positive for increased libido; known diabetes insipidus and adrenal insufficiency. Musculoskeletal: Positive for right-sided sciatica pain and numbness in right leg; reports bilateral knee swelling. Neurological: Denies headaches, dizziness. Psychiatric: Mood is stable; stress related to car issues. All other systems reviewed and are negative.  Past Surgical History:  Procedure Laterality Date   brain tumor removed  02/2022    Outpatient Medications Prior to Visit  Medication Sig Dispense Refill   acetaminophen (TYLENOL) 500 MG tablet Take 2 tablets (1,000 mg total) by mouth every 8 (eight) hours. 180 tablet 0   desmopressin (DDAVP NASAL) 0.01 % solution Place into the nose.     Elastic Bandages & Supports (MEDICAL COMPRESSION STOCKINGS) MISC 1 each by Does not apply route as needed (Leg swelling). 1 each 0   hydrocortisone  (CORTEF) 20 MG tablet Take 20 mg by mouth daily.     levothyroxine (SYNTHROID) 150  MCG tablet Take 150 mcg by mouth every morning.     methylPREDNISolone (MEDROL DOSEPAK) 4 MG TBPK tablet Take as prescribed 1 each 0   naproxen (NAPROSYN) 375 MG tablet Take 1 tablet (375 mg total) by mouth 2 (two) times daily. 20 tablet 0   olmesartan (BENICAR) 40 MG tablet Take 1 tablet (40 mg total) by mouth daily. 90 tablet 3   omeprazole (PRILOSEC) 20 MG capsule Take 1 capsule by mouth daily.     tadalafil (CIALIS) 5 MG tablet Take 1 tablet (5 mg total) by mouth daily. 30 tablet 11   testosterone cypionate (DEPOTESTOSTERONE CYPIONATE) 200 MG/ML injection Inject 200 mg into the muscle. 200 mg every 10 days     No facility-administered medications prior to visit.    Family History  Problem Relation Age of Onset   Bipolar disorder Sister     Social History   Socioeconomic History   Marital status: Married    Spouse name: Not on file   Number of children: Not on file   Years of education: Not on file   Highest education level: Associate degree: occupational, Scientist, product/process development, or vocational program  Occupational History   Not on file  Tobacco Use   Smoking status: Former    Current packs/day: 0.50    Types: Cigarettes    Passive exposure: Never   Smokeless tobacco: Never   Tobacco comments:    Vaping 3 days a week   Vaping Use   Vaping status: Some Days  Substance and Sexual Activity   Alcohol use: Not Currently   Drug use: Yes    Types: Marijuana   Sexual activity: Not on file  Other Topics Concern   Not on file  Social History Narrative   Not on file   Social Drivers of Health   Financial Resource Strain: Low Risk  (09/01/2023)   Overall Financial Resource Strain (CARDIA)    Difficulty of Paying Living Expenses: Not hard at all  Food Insecurity: No Food Insecurity (09/01/2023)   Hunger Vital Sign    Worried About Running Out of Food in the Last Year: Never true    Ran Out of Food in  the Last Year: Never true  Transportation Needs: No Transportation Needs (09/01/2023)   PRAPARE - Administrator, Civil Service (Medical): No    Lack of Transportation (Non-Medical): No  Physical Activity: Insufficiently Active (09/01/2023)   Exercise Vital Sign    Days of Exercise per Week: 1 day    Minutes of Exercise per Session: 10 min  Stress: No Stress Concern Present (09/01/2023)   [employer] of Occupational Health - Occupational Stress Questionnaire    Feeling of Stress : Only a little  Social Connections: Socially Integrated (09/01/2023)   Social Connection and Isolation Panel [NHANES]    Frequency of Communication with Friends and Family: More than three times a week    Frequency of Social Gatherings with Friends and Family: Once a week    Attends Religious Services: More than 4 times per year    Active Member of [employer] or Organizations: Yes    Attends Engineer, structural: More than 4 times per year    Marital Status: Married  Catering manager Violence: Not on file  Objective:  Physical Exam: BP 138/86 (BP Location: Left Arm, Patient Position: Sitting, Cuff Size: Large)   Pulse 80   Temp (!) 97.5 F (36.4 C) (Temporal)   Wt (!) 352 lb 6.4 oz (159.8 kg)   SpO2 97%   BMI 44.05 kg/m     Physical Exam  No results found.  No results found for this or any previous visit (from the past 2160 hours).      Garner Nash, MD, MS

## 2023-09-01 NOTE — Assessment & Plan Note (Signed)
Status post Medrol course pack.  Improved.    Plan: Continue NSAIDs. Referral to orthopedics for further assessment management

## 2023-09-01 NOTE — Assessment & Plan Note (Signed)
Rectal dysfunction lower urinary tract symptoms improved on tadalafil 5 mg daily  Continue current therapy follow-up

## 2023-09-02 LAB — URINALYSIS W MICROSCOPIC + REFLEX CULTURE
Bacteria, UA: NONE SEEN /[HPF]
Bilirubin Urine: NEGATIVE
Glucose, UA: NEGATIVE
Hgb urine dipstick: NEGATIVE
Hyaline Cast: NONE SEEN /[LPF]
Ketones, ur: NEGATIVE
Leukocyte Esterase: NEGATIVE
Nitrites, Initial: NEGATIVE
Specific Gravity, Urine: 1.027 (ref 1.001–1.035)
Squamous Epithelial / HPF: NONE SEEN /[HPF] (ref <=5)
WBC, UA: NONE SEEN /[HPF] (ref 0–5)
pH: 7 (ref 5.0–8.0)

## 2023-09-02 LAB — COMPREHENSIVE METABOLIC PANEL
ALT: 23 U/L (ref 0–53)
AST: 15 U/L (ref 0–37)
Albumin: 4.1 g/dL (ref 3.5–5.2)
Alkaline Phosphatase: 74 U/L (ref 39–117)
BUN: 13 mg/dL (ref 6–23)
CO2: 27 meq/L (ref 19–32)
Calcium: 8.7 mg/dL (ref 8.4–10.5)
Chloride: 99 meq/L (ref 96–112)
Creatinine, Ser: 1.02 mg/dL (ref 0.40–1.50)
GFR: 92.01 mL/min (ref >60.00)
Glucose, Bld: 75 mg/dL (ref 70–99)
Potassium: 4.5 meq/L (ref 3.5–5.1)
Sodium: 135 meq/L (ref 135–145)
Total Bilirubin: 1 mg/dL (ref 0.2–1.2)
Total Protein: 6.4 g/dL (ref 6.0–8.3)

## 2023-09-02 LAB — MICROALBUMIN / CREATININE URINE RATIO
Creatinine,U: 245.6 mg/dL
Microalb Creat Ratio: 1.4 mg/g (ref 0.0–30.0)
Microalb, Ur: 3.6 mg/dL — ABNORMAL HIGH (ref 0.0–1.9)

## 2023-09-02 LAB — LIPID PANEL
Cholesterol: 125 mg/dL (ref 0–200)
HDL: 47.2 mg/dL (ref >39.00)
LDL Cholesterol: 70 mg/dL (ref 0–99)
NonHDL: 78.22
Total CHOL/HDL Ratio: 3
Triglycerides: 39 mg/dL (ref 0.0–149.0)
VLDL: 7.8 mg/dL (ref 0.0–40.0)

## 2023-09-02 LAB — PSA: PSA: 0.32 ng/mL (ref 0.10–4.00)

## 2023-09-02 LAB — NO CULTURE INDICATED

## 2023-09-02 LAB — HEMOGLOBIN A1C: Hgb A1c MFr Bld: 6.3 % (ref 4.6–6.5)

## 2023-09-06 ENCOUNTER — Ambulatory Visit: Payer: Commercial Managed Care - HMO | Admitting: Physical Medicine and Rehabilitation

## 2023-09-07 ENCOUNTER — Other Ambulatory Visit (INDEPENDENT_AMBULATORY_CARE_PROVIDER_SITE_OTHER): Payer: Commercial Managed Care - HMO

## 2023-09-07 ENCOUNTER — Encounter: Payer: Self-pay | Admitting: Physical Medicine and Rehabilitation

## 2023-09-07 ENCOUNTER — Ambulatory Visit: Payer: Commercial Managed Care - HMO | Admitting: Physical Medicine and Rehabilitation

## 2023-09-07 VITALS — BP 143/90 | HR 90

## 2023-09-07 DIAGNOSIS — M5441 Lumbago with sciatica, right side: Secondary | ICD-10-CM | POA: Diagnosis not present

## 2023-09-07 DIAGNOSIS — G8929 Other chronic pain: Secondary | ICD-10-CM

## 2023-09-07 DIAGNOSIS — M5416 Radiculopathy, lumbar region: Secondary | ICD-10-CM | POA: Diagnosis not present

## 2023-09-07 MED ORDER — TRAMADOL HCL 50 MG PO TABS: 50.0000 mg | ORAL_TABLET | Freq: Three times a day (TID) | ORAL | 0 refills | Status: DC | PRN

## 2023-09-07 NOTE — Progress Notes (Signed)
Chronic LBP with radiating symptoms down right side. "All the way to my foot" recently completed a Medrol Dosepak he states this did help "some" but that he does take Hydrocortisone 20 mg daily since craniotomy and tumor resection in 2023 he does not think that it is a good idea to take " a lot of steroids"

## 2023-09-07 NOTE — Progress Notes (Signed)
[name] - 42 y.o. male MRN [id number]  Date of birth: [birth year]  Office Visit Note: Visit Date: 09/07/2023 PCP: Hunter Gunner, MD Referred by: Hunter Gunner, MD  Subjective: Chief Complaint  Patient presents with   Lower Back - Pain   HPI: Hunter Huynh is a 42 y.o. male who comes in today per the request of Dr. Fanny Huynh for evaluation of chronic, worsening and severe right buttock pain radiating down right leg. Pain ongoing for several years, worsens with laying flat and standing. Pain is constant, describes as sore and stabbing sensation, currently rates as 10 out of 10. Some relief of pain with home exercise regimen, rest and use of medications. History of formal chiropractic treatments several years ago with good relief of pain. Lumbar MRI imaging from 2014 shows large extruded disc fragment at L4-L5 compressing the thecal sac and right L5 nerve root. There is also central and rightward protrusion at L5-S1 compressing the right S1 and right L5 nerve roots. History of lumbar epidural steroid injection many years ago with significant relief of pain. Patient currently working full time as short order cook. Patient denies focal weakness, numbness and tingling. No recent trauma or falls.   Patients course is complicated by diabetes insipidus, history of pituitary tumor requiring craniotomy, morbid obesity, chronic swelling to bilateral lower extremities.         Review of Systems  Cardiovascular:  Positive for leg swelling.  Musculoskeletal:  Positive for back pain.  Neurological:  Negative for tingling, sensory change, focal weakness and weakness.  All other systems reviewed and are negative.  Otherwise per HPI.  Assessment & Plan: Visit Diagnoses:    ICD-10-CM   1. Chronic right-sided low back pain with right-sided sciatica  M54.41 XR Lumbar Spine Complete   G89.29 MR LUMBAR SPINE WO CONTRAST    2. Radiculopathy, lumbar region  M54.16 XR Lumbar Spine  Complete    MR LUMBAR SPINE WO CONTRAST    3. Morbid (severe) obesity due to excess calories (HCC)  E66.01 MR LUMBAR SPINE WO CONTRAST       Plan: Findings:  Chronic, worsening and severe right sided buttock pain radiating down right leg. Patient continues to have severe pain despite good conservative therapies such as chiropractic treatments, home exercise regimen, rest and use of medications. Patients clinical presentation and exam are complex, his pain does not follow specific dermatomal pattern. I obtained lumbar radiographs in the office today that show grade 1 anterolisthesis of L5 on S1, there is also disc space narrowing also noted at this level. We discussed treatment plan today, next step is to obtain lumbar MRI imaging. Depending on results of lumbar MRI imaging we discussed possibility of performing lumbar epidural steroid injection. We also discussed medication management today, I prescribed short course of Tramadol for him to take while we are waiting on lumbar MRI imaging. Patient has no questions at this time. No red flag symptoms noted upon exam today.     Meds & Orders: No orders of the defined types were placed in this encounter.   Orders Placed This Encounter  Procedures   XR Lumbar Spine Complete   MR LUMBAR SPINE WO CONTRAST    Follow-up: Return for Lumbar MRI review.   Procedures: No procedures performed      Clinical History: No specialty comments available.   He reports that he has quit smoking. His smoking use included cigarettes. He has never been exposed to tobacco smoke. He  has never used smokeless tobacco.  Recent Labs    09/01/23 1519  HGBA1C 6.3    Objective:  VS:  HT:    WT:   BMI:     BP:(!) 143/90  HR:90bpm  TEMP: ( )  RESP:  Physical Exam Vitals and nursing note reviewed.  HENT:     Head: Normocephalic and atraumatic.     Right Ear: External ear normal.     Left Ear: External ear normal.     Nose: Nose normal.     Mouth/Throat:      Mouth: Mucous membranes are moist.  Eyes:     Extraocular Movements: Extraocular movements intact.  Cardiovascular:     Rate and Rhythm: Normal rate.     Pulses: Normal pulses.  Pulmonary:     Effort: Pulmonary effort is normal.  Abdominal:     General: Abdomen is flat. There is no distension.  Musculoskeletal:        General: Tenderness present.     Cervical back: Normal range of motion.     Right lower leg: Edema present.     Left lower leg: Edema present.     Comments: Patient rises from seated position to standing without difficulty. Good lumbar range of motion. No pain noted with facet loading. 5/5 strength noted with bilateral hip flexion, knee flexion/extension, ankle dorsiflexion/plantarflexion and EHL. No clonus noted bilaterally. No pain upon palpation of greater trochanters. No pain with internal/external rotation of bilateral hips. Sensation intact bilaterally. Negative slump test bilaterally. Ambulates without aid, gait steady.     Skin:    General: Skin is warm and dry.     Capillary Refill: Capillary refill takes less than 2 seconds.  Neurological:     General: No focal deficit present.     Mental Status: He is alert and oriented to person, place, and time.  Psychiatric:        Mood and Affect: Mood normal.        Behavior: Behavior normal.     Ortho Exam  Imaging: No results found.  Past Medical/Family/Surgical/Social History: Medications & Allergies reviewed per EMR, new medications updated. Patient Active Problem List   Diagnosis Date Noted   Chronic low back pain with right-sided sciatica 09/01/2023   Class 3 severe obesity with serious comorbidity and body mass index (BMI) of 45.0 to 49.9 in adult [facility]) 08/02/2023   Lower urinary tract symptoms (LUTS) 08/02/2023   Erectile dysfunction due to diseases classified elsewhere 08/02/2023   Exposure to STD 03/04/2023   Testosterone deficiency 11/25/2022   History of pulmonary embolus (PE) 11/25/2022   Swelling  of both hands 11/25/2022   Family history of bipolar disorder 11/25/2022   Generalized edema worse in lower extremities 11/25/2022   Concern about mental disorder without diagnosis 11/25/2022   Brain tumor (HCC) 04/05/2022   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/02/2022   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   Diabetes insipidus (HCC) 02/13/2022   Primary hypertension 01/15/2022   Past Medical History:  Diagnosis Date   History of brain surgery 02/2022   History of streptococcal sore throat    Hypertension    Family History  Problem Relation Age of Onset   Bipolar disorder Sister    Past Surgical History:  Procedure Laterality Date   brain tumor removed  02/2022   Social History   Occupational History   Not on file  Tobacco Use   Smoking status: Former    Current packs/day: 0.50  Types: Cigarettes    Passive exposure: Never   Smokeless tobacco: Never   Tobacco comments:    Vaping 3 days a week   Vaping Use   Vaping status: Some Days  Substance and Sexual Activity   Alcohol use: Not Currently   Drug use: Yes    Types: Marijuana   Sexual activity: Not on file

## 2023-09-19 ENCOUNTER — Encounter: Payer: Self-pay | Admitting: Physical Medicine and Rehabilitation

## 2023-09-22 ENCOUNTER — Encounter: Payer: Self-pay | Admitting: Physical Medicine and Rehabilitation

## 2023-09-24 ENCOUNTER — Other Ambulatory Visit: Payer: Commercial Managed Care - HMO

## 2023-09-30 ENCOUNTER — Other Ambulatory Visit: Payer: Self-pay | Admitting: Physical Medicine and Rehabilitation

## 2023-09-30 MED ORDER — TRAMADOL HCL 50 MG PO TABS: 50.0000 mg | ORAL_TABLET | Freq: Three times a day (TID) | ORAL | 0 refills | Status: DC | PRN

## 2023-10-05 ENCOUNTER — Other Ambulatory Visit: Payer: Self-pay | Admitting: Physical Medicine and Rehabilitation

## 2023-10-05 MED ORDER — MELOXICAM 15 MG PO TABS: 15.0000 mg | ORAL_TABLET | Freq: Every day | ORAL | 0 refills | Status: DC

## 2023-11-02 ENCOUNTER — Other Ambulatory Visit: Payer: Self-pay | Admitting: Physical Medicine and Rehabilitation

## 2023-11-19 IMAGING — MR MR HEAD WO/W CM
13 series · 48 of 48 positions shown · IV contrast (gadavist)
Comparison: Plain head CT 9115 hours.

CLINICAL DATA: 40-year-old male with headache and suprasellar mass
on head CT this morning.

EXAM:
MRI HEAD WITHOUT AND WITH CONTRAST
TECHNIQUE: Multiplanar, multiecho pulse sequences of the brain and surrounding
structures were obtained without and with intravenous contrast.
CONTRAST:  10mL GADAVIST GADOBUTROL 1 MMOL/ML IV SOLN

[Series 5: DWI · axial · 3.0mm · 1.36mm/px · z∈[-64,+87]mm · 7 of 104 slices shown (1 of 2)]
[im 1/104]
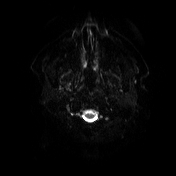
[im 18/104]
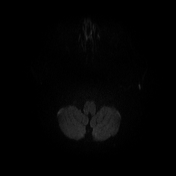
[im 35/104]
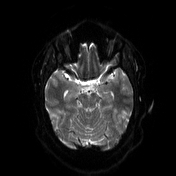
[im 52/104]
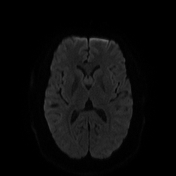
[im 69/104]
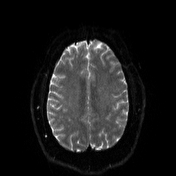
[im 86/104]
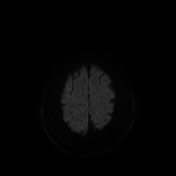
[im 104/104]
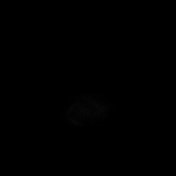

[Series 6: DWI · axial · 3.0mm · 1.36mm/px · z∈[-64,+87]mm · 4 of 52 slices shown (2 of 2)]
[im 1/52]
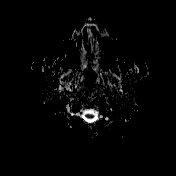
[im 18/52]
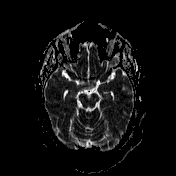
[im 35/52]
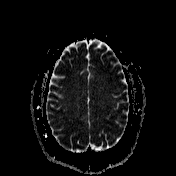
[im 52/52]
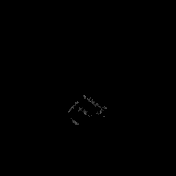

[Series 7: T1 · sagittal · 5.0mm · 0.75mm/px · 1 of 25 slices shown (1 of 2)]
[im 1/25]
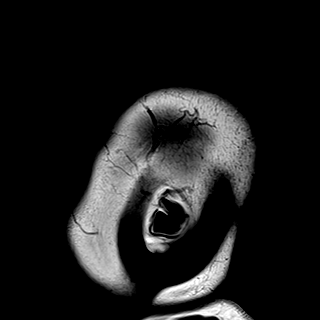

[Series 8: T2 · axial · 5.0mm · 0.62mm/px · 1 of 25 slices shown]
[im 1/25]
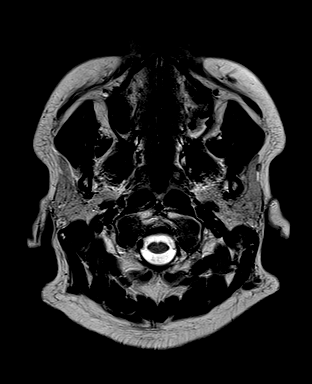

[Series 9: swi_images · axial · 3.0mm · 0.75mm/px · z∈[-99,+112]mm · 4 of 72 slices shown]
[im 1/72]
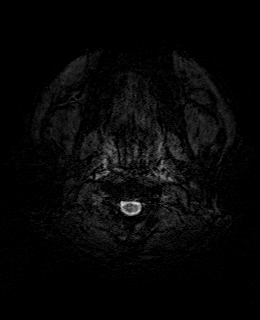
[im 24/72]
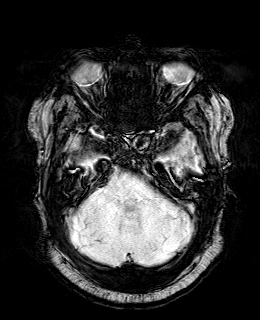
[im 48/72]
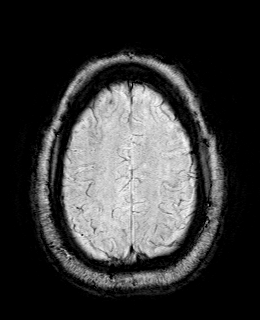
[im 72/72]
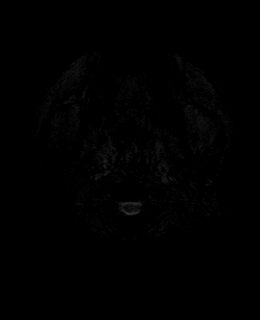

[Series 11: FLAIR · axial · 3.0mm · 0.75mm/px · z∈[-69,+82]mm · 3 of 52 slices shown]
[im 1/52]
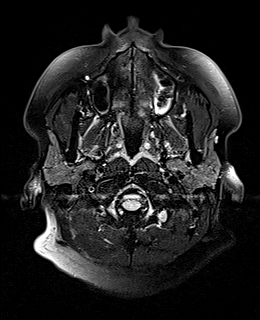
[im 26/52]
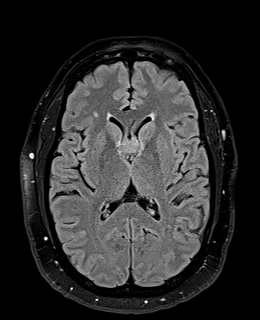
[im 52/52]
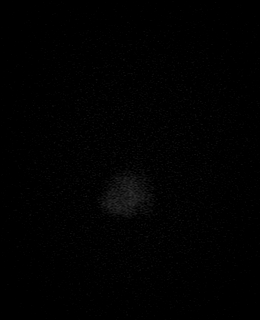

[Series 12: T1 · axial · 1.0mm · 0.94mm/px · z∈[-79,+79]mm · 9 of 160 slices shown (2 of 2)]
[im 1/160]
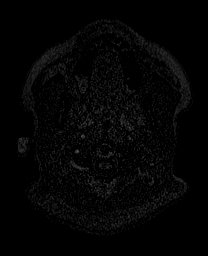
[im 20/160]
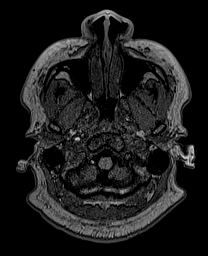
[im 40/160]
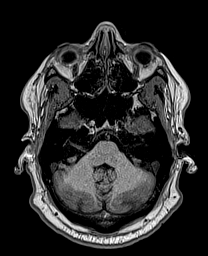
[im 60/160]
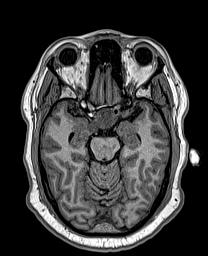
[im 80/160]
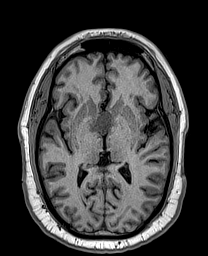
[im 100/160]
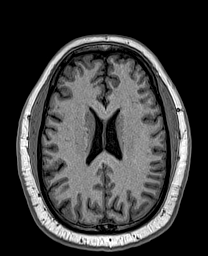
[im 120/160]
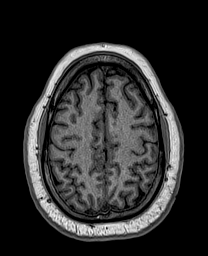
[im 140/160]
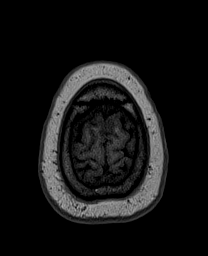
[im 160/160]
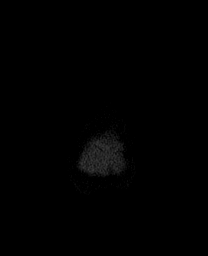

[Series 13: cor dwi_tracew · coronal · 5.0mm · 1.53mm/px · 3 of 56 slices shown]
[im 1/56]
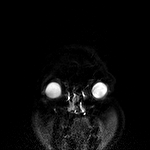
[im 28/56]
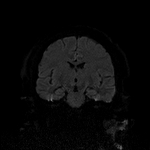
[im 56/56]
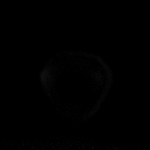

[Series 14: cor dwi_adc · coronal · 5.0mm · 1.53mm/px · 2 of 28 slices shown]
[im 1/28]
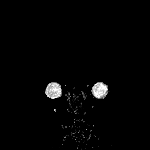
[im 28/28]
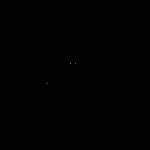

[Series 15: T2 post-contrast · coronal · 5.0mm · 0.57mm/px · 2 of 28 slices shown]
[im 1/28]
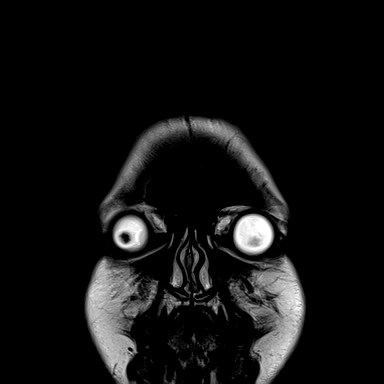
[im 28/28]
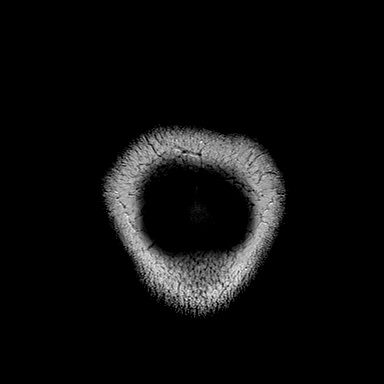

[Series 16: T1 post-contrast · axial · 1.0mm · 0.94mm/px · z∈[-79,+79]mm · 9 of 160 slices shown (1 of 3)]
[im 1/160]
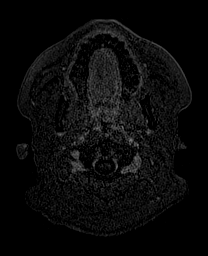
[im 20/160]
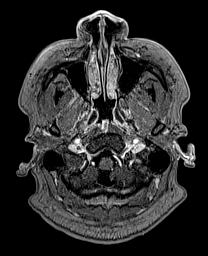
[im 40/160]
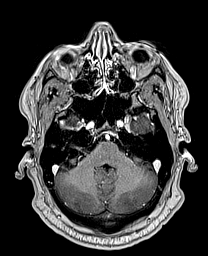
[im 60/160]
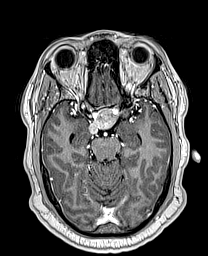
[im 80/160]
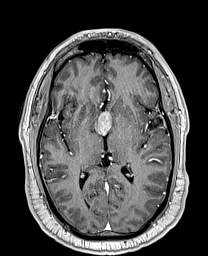
[im 100/160]
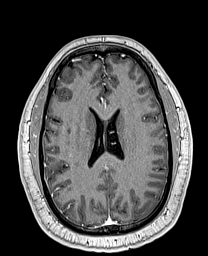
[im 120/160]
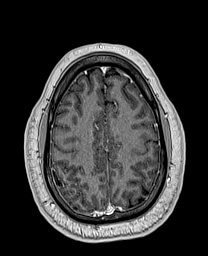
[im 140/160]
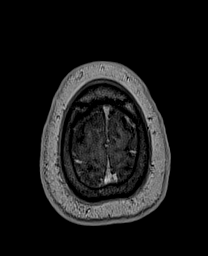
[im 160/160]
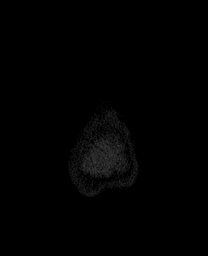

[Series 17: T1 post-contrast · coronal · 5.0mm · 0.43mm/px · 2 of 28 slices shown (2 of 3)]
[im 1/28]
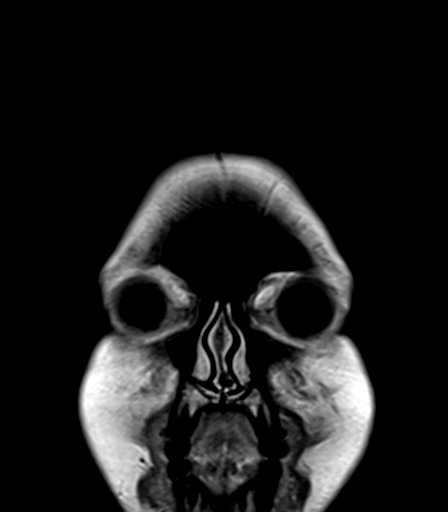
[im 28/28]
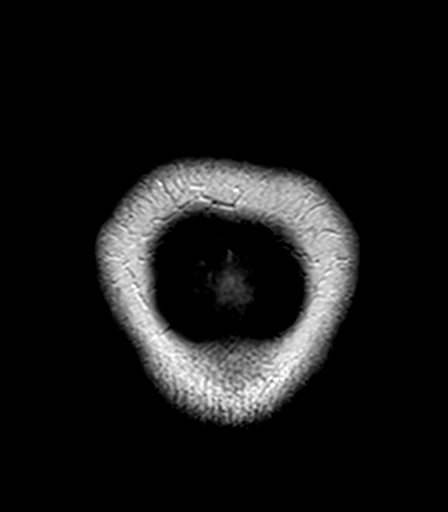

[Series 18: T1 post-contrast · sagittal · 5.0mm · 0.75mm/px · 1 of 25 slices shown (3 of 3)]
[im 1/25]
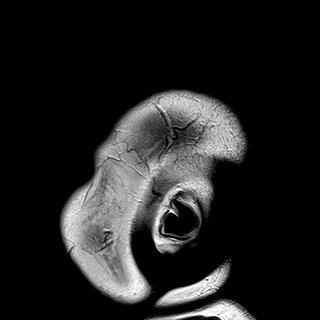

[48 of 48 positions shown; findings below may reference images not displayed]

FINDINGS: Brain: Lobulated, heterogeneously T2 hyperintense and enhancing mass
occupies the sella turcica with substantial suprasellar extension.
The lesion encompasses 25 by 31 by 44 mm (AP by transverse by CC).
No normal pituitary gland is identified.a evidence of extension into
the left cavernous sinus (series 17, image 19). Right cavernous
sinus appears spared.

Pronounced suprasellar mass effect, optic chiasm obscured. Mass
effect on the hypothalamus. But no associated cerebral edema.

No other abnormal intracranial enhancement. On axial DWI there is a
punctate focus of apparent restricted diffusion in the left superior
frontal gyrus subcortical white matter on series 5, image 9,
although not correlated on the coronal DWI. No convincing T2 or
FLAIR hyperintensity. Suspect this may be artifact.

No other restricted diffusion to suggest acute infarction. No
midline shift, ventriculomegaly, extra-axial collection or acute
intracranial hemorrhage. Normal overall cerebral volume.
Cervicomedullary junction is within normal limits. No cortical
encephalomalacia identified. Although there is widely scattered
small foci of subcortical white matter T2 and FLAIR hyperintensity
in both hemispheres. No chronic cerebral blood products identified.

Vascular: Major intracranial vascular flow voids are preserved. Mild
mass effect on the carotid termini and ACA A1 segments. Major dural
venous sinuses are enhancing and appear to be patent.

Skull and upper cervical spine: Skull base remains intact. Negative
visible cervical spine. Visualized bone marrow signal is within
normal limits.

Sinuses/Orbits: Intraorbital soft tissues are symmetric and normal.
Downward bowing of the floor of the sella turcica into the sphenoid
sinuses (series 7, image 13), but paranasal sinuses and mastoids
remain clear.

Other: Visible internal auditory structures appear normal. Negative
visible scalp and face.
IMPRESSION: 1. Relatively large, lobulated up to 4.4 cm intrasellar mass with
suprasellar extension most compatible with Pituitary Macroadenoma.
Invasion of the left cavernous sinus and substantial suprasellar
mass effect, but no cerebral edema.

2. Otherwise mild but advanced for age scattered white matter T2 and
FLAIR hyperintensity in a nonspecific configuration. Questionable
punctate restricted diffusion in the left superior frontal gyrus
subcortical white matter might be artifact, but the constellation
raises the possibility of small vessel disease.

## 2023-12-07 ENCOUNTER — Other Ambulatory Visit: Payer: Self-pay | Admitting: Physical Medicine and Rehabilitation

## 2023-12-07 MED ORDER — MELOXICAM 15 MG PO TABS: 15.0000 mg | ORAL_TABLET | Freq: Every day | ORAL | 0 refills | Status: DC

## 2023-12-08 ENCOUNTER — Other Ambulatory Visit (HOSPITAL_COMMUNITY): Payer: Self-pay

## 2023-12-08 ENCOUNTER — Encounter: Payer: Self-pay | Admitting: Family Medicine

## 2023-12-08 ENCOUNTER — Telehealth: Payer: Self-pay

## 2023-12-08 ENCOUNTER — Other Ambulatory Visit: Payer: Self-pay | Admitting: Physical Medicine and Rehabilitation

## 2023-12-08 MED ORDER — MELOXICAM 15 MG PO TABS: 15.0000 mg | ORAL_TABLET | Freq: Every day | ORAL | 0 refills | Status: DC

## 2023-12-08 NOTE — Telephone Encounter (Signed)
 Noted. Thanks.

## 2023-12-08 NOTE — Telephone Encounter (Signed)
 Pharmacy Patient Advocate Encounter  Received notification from [employer] that Prior Authorization for Tadalafil 5mg  tabs has been APPROVED from 12/08/23 to 12/07/24   PA #/Case ID/Reference #: [id number]  Left a voice message at [facility] to notify of the approval.

## 2023-12-08 NOTE — Telephone Encounter (Signed)
 Pharmacy Patient Advocate Encounter   Received notification from Patient Advice Request messages that prior authorization for Tadalafil 5mg  tabs is required/requested.   Insurance verification completed.   The patient is insured through [employer] .   Per test claim: PA required; PA submitted to above mentioned insurance via CoverMyMeds Key/confirmation #/EOC [id number] Status is pending

## 2023-12-19 ENCOUNTER — Emergency Department (HOSPITAL_COMMUNITY)

## 2023-12-19 ENCOUNTER — Emergency Department (HOSPITAL_COMMUNITY)
Admission: EM | Admit: 2023-12-19 | Discharge: 2023-12-19 | Disposition: A | Discharge status: Home / Self Care | Attending: Emergency Medicine | Admitting: Emergency Medicine

## 2023-12-19 ENCOUNTER — Encounter (HOSPITAL_COMMUNITY): Payer: Self-pay

## 2023-12-19 ENCOUNTER — Other Ambulatory Visit: Payer: Self-pay

## 2023-12-19 DIAGNOSIS — I10 Essential (primary) hypertension: Secondary | ICD-10-CM | POA: Insufficient documentation

## 2023-12-19 DIAGNOSIS — E119 Type 2 diabetes mellitus without complications: Secondary | ICD-10-CM | POA: Insufficient documentation

## 2023-12-19 DIAGNOSIS — Z86018 Personal history of other benign neoplasm: Secondary | ICD-10-CM | POA: Diagnosis not present

## 2023-12-19 DIAGNOSIS — Z79899 Other long term (current) drug therapy: Secondary | ICD-10-CM | POA: Insufficient documentation

## 2023-12-19 DIAGNOSIS — R197 Diarrhea, unspecified: Secondary | ICD-10-CM | POA: Diagnosis not present

## 2023-12-19 DIAGNOSIS — R059 Cough, unspecified: Secondary | ICD-10-CM | POA: Insufficient documentation

## 2023-12-19 DIAGNOSIS — R519 Headache, unspecified: Secondary | ICD-10-CM | POA: Diagnosis not present

## 2023-12-19 DIAGNOSIS — R509 Fever, unspecified: Secondary | ICD-10-CM | POA: Insufficient documentation

## 2023-12-19 DIAGNOSIS — R61 Generalized hyperhidrosis: Secondary | ICD-10-CM | POA: Insufficient documentation

## 2023-12-19 DIAGNOSIS — R0981 Nasal congestion: Secondary | ICD-10-CM | POA: Insufficient documentation

## 2023-12-19 DIAGNOSIS — R6889 Other general symptoms and signs: Secondary | ICD-10-CM

## 2023-12-19 HISTORY — DX: Type 2 diabetes mellitus without complications: E11.9

## 2023-12-19 LAB — URINALYSIS, ROUTINE W REFLEX MICROSCOPIC
Bacteria, UA: NONE SEEN
Bilirubin Urine: NEGATIVE
Glucose, UA: NEGATIVE mg/dL
Hgb urine dipstick: NEGATIVE
Ketones, ur: 5 mg/dL — AB
Leukocytes,Ua: NEGATIVE
Nitrite: NEGATIVE
Protein, ur: 30 mg/dL — AB
Specific Gravity, Urine: 1.014 (ref 1.005–1.030)
pH: 8 (ref 5.0–8.0)

## 2023-12-19 LAB — CBC
HCT: 44.5 % (ref 39.0–52.0)
Hemoglobin: 14.6 g/dL (ref 13.0–17.0)
MCH: 27.5 pg (ref 26.0–34.0)
MCHC: 32.8 g/dL (ref 30.0–36.0)
MCV: 83.8 fL (ref 80.0–100.0)
Platelets: 300 10*3/uL (ref 150–400)
RBC: 5.31 MIL/uL (ref 4.22–5.81)
RDW: 15.1 % (ref 11.5–15.5)
WBC: 7 10*3/uL (ref 4.0–10.5)
nRBC: 0 % (ref 0.0–0.2)

## 2023-12-19 LAB — COMPREHENSIVE METABOLIC PANEL WITH GFR
ALT: 41 U/L (ref 0–44)
AST: 29 U/L (ref 15–41)
Albumin: 4 g/dL (ref 3.5–5.0)
Alkaline Phosphatase: 76 U/L (ref 38–126)
Anion gap: 10 (ref 5–15)
BUN: 16 mg/dL (ref 6–20)
CO2: 23 mmol/L (ref 22–32)
Calcium: 8.6 mg/dL — ABNORMAL LOW (ref 8.9–10.3)
Chloride: 98 mmol/L (ref 98–111)
Creatinine, Ser: 0.98 mg/dL (ref 0.61–1.24)
GFR, Estimated: >60 mL/min (ref >60)
Glucose, Bld: 107 mg/dL — ABNORMAL HIGH (ref 70–99)
Potassium: 3.9 mmol/L (ref 3.5–5.1)
Sodium: 131 mmol/L — ABNORMAL LOW (ref 135–145)
Total Bilirubin: 0.9 mg/dL (ref 0.0–1.2)
Total Protein: 7.7 g/dL (ref 6.5–8.1)

## 2023-12-19 LAB — LIPASE, BLOOD: Lipase: 29 U/L (ref 11–51)

## 2023-12-19 LAB — RESP PANEL BY RT-PCR (RSV, FLU A&B, COVID)  RVPGX2
Influenza A by PCR: NEGATIVE
Influenza B by PCR: NEGATIVE
Resp Syncytial Virus by PCR: NEGATIVE
SARS Coronavirus 2 by RT PCR: NEGATIVE

## 2023-12-19 NOTE — Discharge Instructions (Signed)
 Your test results today are reassuring.  Continue supportive care at home with ibuprofen and acetaminophen as needed.  Drink plenty of fluids to stay hydrated.  Return to the emergency department at anytime for any new or worsening symptoms of concern.

## 2023-12-19 NOTE — ED Triage Notes (Signed)
 Patient stated he has had diarrhea since Friday along with a fever/feels hot then cold. Has a headache. No vomiting.

## 2023-12-19 NOTE — ED Provider Notes (Signed)
 [facility] AT [facility] Provider Note   CSN: [id number] Arrival date & time: 12/19/23  4696     History  Chief Complaint  Patient presents with   Diarrhea    Hunter Huynh is a 43 y.o. male.   Diarrhea Associated symptoms: chills, diaphoresis, fever and headaches   Patient presents for flulike symptoms.  Medical history includes HTN, DM, PE, chronic low back pain, pituitary tumor.  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  He does follow with [facility] neurosurgery.  He underwent MRI 4 days ago which did not show any recurrence of tumor.  For the past 3 days, he has had cough, congestion, fevers, chills, headache.  He has been treating his symptoms with DayQuil and ibuprofen.  He has had small amount of watery diarrhea.  He denies any nausea, vomiting, or decreased p.o. intake.  He has been taking some medications as prescribed.     Home Medications Prior to Admission medications   Medication Sig Start Date End Date Taking? Authorizing Provider  desmopressin (DDAVP NASAL) 0.01 % solution Place into the nose. 04/02/22   [provider]  Elastic Bandages & Supports (MEDICAL COMPRESSION STOCKINGS) MISC 1 each by Does not apply route as needed (Leg swelling). 01/11/23   Garnette Gunner, MD  hydrocortisone (CORTEF) 20 MG tablet Take 20 mg by mouth daily. 03/26/22   [provider]  levothyroxine (SYNTHROID) 150 MCG tablet Take 150 mcg by mouth every morning. 03/27/22   [provider]  meloxicam (MOBIC) 15 MG tablet Take 1 tablet (15 mg total) by mouth daily. 12/08/23   Juanda Chance, NP  methylPREDNISolone (MEDROL DOSEPAK) 4 MG TBPK tablet Take as prescribed 08/26/23   [name], [name], MD  naproxen (NAPROSYN) 375 MG tablet Take 1 tablet (375 mg total) by mouth 2 (two) times daily. 08/26/23   [name], [name], MD  olmesartan  (BENICAR) 40 MG tablet Take 1 tablet (40 mg total) by mouth daily. 08/02/23 07/27/24  Garnette Gunner, MD  omeprazole (PRILOSEC) 20 MG capsule Take 1 capsule by mouth daily.    [provider]  tadalafil (CIALIS) 5 MG tablet Take 1 tablet (5 mg total) by mouth daily. 08/02/23   Garnette Gunner, MD  testosterone cypionate (DEPOTESTOSTERONE CYPIONATE) 200 MG/ML injection Inject 200 mg into the muscle. 200 mg every 10 days    [provider]  traMADol (ULTRAM) 50 MG tablet Take 1 tablet (50 mg total) by mouth every 8 (eight) hours as needed. 09/07/23   Juanda Chance, NP  traMADol (ULTRAM) 50 MG tablet Take 1 tablet (50 mg total) by mouth every 8 (eight) hours as needed. 09/30/23 09/29/24  Juanda Chance, NP      Allergies    Patient has no known allergies.    Review of Systems   Review of Systems  Constitutional:  Positive for chills, diaphoresis and fever.  HENT:  Positive for congestion, rhinorrhea and sore throat.   Respiratory:  Positive for cough.   Gastrointestinal:  Positive for diarrhea.  Neurological:  Positive for headaches.  All other systems reviewed and are negative.   Physical Exam Updated Vital Signs BP (!) 142/102   Pulse 87   Temp 98.2 F (36.8 C)   Resp 18   Ht 6\' 3"  (1.905 m)   Wt (!) 154.2 kg   SpO2 95%   BMI 42.50 kg/m  Physical Exam Vitals and nursing note reviewed.  Constitutional:      General: He is not in acute distress.    Appearance: Normal appearance. He is well-developed. He is diaphoretic. He is not ill-appearing or toxic-appearing.  HENT:     Head: Normocephalic and atraumatic.     Right Ear: External ear normal.     Left Ear: External ear normal.     Nose: Nose normal.     Mouth/Throat:     Mouth: Mucous membranes are moist.     Pharynx: No oropharyngeal exudate or posterior oropharyngeal erythema.  Eyes:     Extraocular Movements: Extraocular movements intact.     Conjunctiva/sclera: Conjunctivae normal.   Cardiovascular:     Rate and Rhythm: Normal rate and regular rhythm.     Heart sounds: No murmur heard. Pulmonary:     Effort: Pulmonary effort is normal. No respiratory distress.     Breath sounds: Normal breath sounds. No wheezing or rales.  Chest:     Chest wall: No tenderness.  Abdominal:     General: There is no distension.     Palpations: Abdomen is soft.     Tenderness: There is no abdominal tenderness.  Musculoskeletal:        General: No swelling. Normal range of motion.     Cervical back: Normal range of motion and neck supple.  Skin:    General: Skin is warm.     Coloration: Skin is not jaundiced or pale.  Neurological:     General: No focal deficit present.     Mental Status: He is alert and oriented to person, place, and time.  Psychiatric:        Mood and Affect: Mood normal.        Behavior: Behavior normal.     ED Results / Procedures / Treatments   Labs (all labs ordered are listed, but only abnormal results are displayed) Labs Reviewed  COMPREHENSIVE METABOLIC PANEL WITH GFR - Abnormal; Notable for the following components:      Result Value   Sodium 131 (*)    Glucose, Bld 107 (*)    Calcium 8.6 (*)    All other components within normal limits  URINALYSIS, ROUTINE W REFLEX MICROSCOPIC - Abnormal; Notable for the following components:   Ketones, ur 5 (*)    Protein, ur 30 (*)    All other components within normal limits  RESP PANEL BY RT-PCR (RSV, FLU A&B, COVID)  RVPGX2  LIPASE, BLOOD  CBC    EKG None  Radiology DG Chest Portable 1 View Result Date: 12/19/2023 CLINICAL DATA:  Cough. EXAM: PORTABLE CHEST 1 VIEW COMPARISON:  Chest radiograph dated 01/23/2016. FINDINGS: Shallow inspiration. No focal consolidation, pleural effusion, or pneumothorax. The cardiac silhouette is within normal limits. No acute osseous pathology. IMPRESSION: No active disease. Electronically Signed   By: Elgie Collard M.D.   On: 12/19/2023 12:47     Procedures Procedures    Medications Ordered in ED Medications - No data to display  ED Course/ Medical Decision Making/ A&P                                 Medical Decision Making Amount and/or Complexity of Data Reviewed Labs: ordered. Radiology: ordered.   This patient presents to the ED for concern of flulike symptoms, this involves an extensive number of treatment options, and is a complaint that carries with it  a high risk of complications and morbidity.  The differential diagnosis includes URI, pneumonia, seasonal allergies, other infection   Co morbidities that complicate the patient evaluation  HTN, DM, PE, chronic low back pain, pituitary tumor   Additional history obtained:  Additional history obtained from N/A External records from outside source obtained and reviewed including EMR   Lab Tests:  I Ordered, and personally interpreted labs.  The pertinent results include: Normal kidney function, normal electrolytes, normal hemoglobin, no leukocytosis, COVID/flu negative   Imaging Studies ordered:  I ordered imaging studies including chest x-ray I independently visualized and interpreted imaging which showed no acute findings I agree with the radiologist interpretation   Cardiac Monitoring: / EKG:  The patient was maintained on a cardiac monitor.  I personally viewed and interpreted the cardiac monitored which showed an underlying rhythm of: Sinus rhythm  Problem List / ED Course / Critical interventions / Medication management  Patient presents for flu symptoms for the past 3 days.  Symptoms have included cough, sore throat, headache, fevers, chills.  He states that he will have intermittent episodes of diaphoresis.  On arrival in the ED, he is diaphoretic.  He is otherwise well-appearing.  He is found to be afebrile at this time.  Patient's breathing is unlabored.  He is able to speak in complete sentences.  SpO2 is normal on room air.  Lungs are  clear to auscultation.  COVID/flu swab was ordered.  Although patient has had headaches with history of pituitary adenoma, he does state that he underwent a negative MRI of brain 4 days ago.  Will avoid repeat imaging today.  He declines any headache/pain medication at this time.  Lab work and chest x-ray were unremarkable.  On reassessment, patient resting comfortably.  He states that his symptoms have improved without any interventions.  He is no longer diaphoretic.  When asked about his diaphoresis, patient states that this is common for him.  He feels like it may be a side effect from his prior pituitary surgery.  Patient was advised to continue supportive care at home and to return for any worsening of symptoms.  He was discharged in stable condition.         Final Clinical Impression(s) / ED Diagnoses Final diagnoses:  Flu-like symptoms    Rx / DC Orders ED Discharge Orders     None         Gloris Manchester, MD 12/19/23 1325

## 2023-12-21 ENCOUNTER — Telehealth: Payer: Self-pay

## 2023-12-21 NOTE — Transitions of Care (Post Inpatient/ED Visit) (Signed)
 12/21/2023  Name: Hunter Huynh MRN: [id number] DOB: Feb 10, 1981  Today's TOC FU Call Status: Today's TOC FU Call Status:: Successful TOC FU Call Completed TOC FU Call Complete Date: 12/21/23 Patient's Name and Date of Birth confirmed.  Transition Care Management Follow-up Telephone Call Date of Discharge: 12/19/23 Discharge Facility: [facility] ([facility]) Type of Discharge: Emergency Department Reason for ED Visit: Other: (Flu-like symptoms and Diarrhea) How have you been since you were released from the hospital?: Same Any questions or concerns?: Yes Patient Questions/Concerns:: Pt C/O of diarrhea and headaches Patient Questions/Concerns Addressed: Provided Patient Educational Materials  Items Reviewed: Did you receive and understand the discharge instructions provided?: Yes Medications obtained,verified, and reconciled?: Yes (Medications Reviewed) Any new allergies since your discharge?: No Dietary orders reviewed?: NA Do you have support at home?: No  Medications Reviewed Today: Medications Reviewed Today     Reviewed by Leroy Kennedy, CMA (Certified Medical Assistant) on 12/21/23 at [phone]  Med List Status: <None>   Medication Order Taking? Sig Documenting Provider Last Dose Status Informant  desmopressin (DDAVP NASAL) 0.01 % solution [id number] Yes Place into the nose. [provider] Taking Active   desmopressin (DDAVP) 0.01 % SOLN [id number] Yes USE 1 DOSE TWICE DAILY . KEEP UPCOMING APPOINTMENT FOR FUTURE REFILLS [provider] Taking Active   Elastic Bandages & Supports (MEDICAL COMPRESSION STOCKINGS) MISC [id number] Yes 1 each by Does not apply route as needed (Leg swelling). Garnette Gunner, MD Taking Active   hydrocortisone (CORTEF) 20 MG tablet [id number] Yes Take 20 mg by mouth daily. [provider] Taking Active   levothyroxine (SYNTHROID) 150 MCG tablet [id number] Yes Take 150 mcg by mouth every morning. [provider]  Taking Active   meloxicam (MOBIC) 15 MG tablet [id number] Yes Take 1 tablet (15 mg total) by mouth daily. Juanda Chance, NP Taking Active   methylPREDNISolone (MEDROL DOSEPAK) 4 MG TBPK tablet [id number] Yes Take as prescribed [name], [name], MD Taking Active   naproxen (NAPROSYN) 375 MG tablet [id number] Yes Take 1 tablet (375 mg total) by mouth 2 (two) times daily. [name], [name], MD Taking Active   olmesartan (BENICAR) 40 MG tablet [id number] Yes Take 1 tablet (40 mg total) by mouth daily. Garnette Gunner, MD Taking Active   omeprazole (PRILOSEC) 20 MG capsule [id number] Yes Take 1 capsule by mouth daily. [provider] Taking Active   tadalafil (CIALIS) 5 MG tablet [id number] Yes Take 1 tablet (5 mg total) by mouth daily. Garnette Gunner, MD Taking Active   testosterone cypionate (DEPOTESTOSTERONE CYPIONATE) 200 MG/ML injection [id number] Yes Inject 200 mg into the muscle. 200 mg every 10 days [provider] Taking Active   traMADol (ULTRAM) 50 MG tablet [id number] Yes Take 1 tablet (50 mg total) by mouth every 8 (eight) hours as needed. Juanda Chance, NP Taking Active   traMADol Janean Sark) 50 MG tablet [id number] Yes Take 1 tablet (50 mg total) by mouth every 8 (eight) hours as needed. Juanda Chance, NP Taking Active             Home Care and Equipment/Supplies: Were Home Health Services Ordered?: NA Any new equipment or medical supplies ordered?: NA  Functional Questionnaire: Do you need assistance with bathing/showering or dressing?: No Do you need assistance with meal preparation?: No Do you need assistance with eating?: No Do you have difficulty maintaining continence: No Do you need assistance with getting out of bed/getting out of a chair/moving?: No Do you have difficulty managing  or taking your medications?: No  Follow up appointments reviewed: PCP Follow-up appointment confirmed?: Yes Date of PCP follow-up appointment?: 12/22/23 Follow-up  Provider: Dr. Janee Morn [facility] Follow-up appointment confirmed?: NA Do you need transportation to your follow-up appointment?: No Do you understand care options if your condition(s) worsen?: Yes-patient verbalized understanding    SIGNATURE Jodelle Green, RMA

## 2023-12-22 ENCOUNTER — Ambulatory Visit: Admitting: Family Medicine

## 2023-12-26 ENCOUNTER — Ambulatory Visit: Admitting: Family Medicine

## 2024-01-10 ENCOUNTER — Ambulatory Visit: Admitting: Physical Medicine and Rehabilitation

## 2024-01-10 ENCOUNTER — Encounter: Payer: Self-pay | Admitting: Physical Medicine and Rehabilitation

## 2024-01-10 DIAGNOSIS — M5441 Lumbago with sciatica, right side: Secondary | ICD-10-CM | POA: Diagnosis not present

## 2024-01-10 DIAGNOSIS — M5416 Radiculopathy, lumbar region: Secondary | ICD-10-CM | POA: Diagnosis not present

## 2024-01-10 DIAGNOSIS — R202 Paresthesia of skin: Secondary | ICD-10-CM

## 2024-01-10 DIAGNOSIS — G8929 Other chronic pain: Secondary | ICD-10-CM

## 2024-01-10 NOTE — Progress Notes (Signed)
 Hunter Huynh - 43 y.o. male MRN [id number]  Date of birth: 10/03/1980  Office Visit Note: Visit Date: 01/10/2024 PCP: Catheryn Cluck, MD Referred by: Catheryn Cluck, MD  Subjective: Chief Complaint  Patient presents with   Right Hand - Numbness, Weakness   Right Leg - Numbness   HPI: Hunter Huynh is a 43 y.o. male who comes in today for evaluation of 2 separate issues, chronic, worsening and severe right buttock pain radiating down right leg. Also reports numbness/tingling to right hand and right foot. Lower back pain ongoing for several years, worsens with laying flat and standing. Pain is constant, describes as sore and stabbing sensation, mild lower back pain today. Some relief of pain with home exercise regimen, rest and use of medications. History of formal chiropractic treatments several years ago with good relief of pain. He has tried physician directed home exercise regimen and meloxicam  for 6 plus weeks with minor improvement of pain. Lumbar MRI imaging from 2014 shows large extruded disc fragment at L4-L5 compressing the thecal sac and right L5 nerve root. There is also central and rightward protrusion at L5-S1 compressing the right S1 and right L5 nerve roots. 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 History of lumbar epidural steroid injection many years ago with significant relief of pain. I did place order for new lumbar MRI imaging during our office visit in December of 2024, however this request was denied by his insurance company. Patient currently working full time as short order cook. Patient denies focal weakness. No recent falls or injuries.   Chronic, worsening and severe numbness/tingling to right hand and foot for several months. Symptoms started after taking Meloxicam  for his lower back. He reports numbness to right thumb, index and middle fingers. Also reports numbness to toes of right foot. No history of prior  evaluation/nerve studies.   Patients course is complicated by diabetes insipidus, history of pituitary tumor requiring craniotomy, morbid obesity, chronic swelling to bilateral lower extremities.     Review of Systems  Musculoskeletal:  Positive for back pain.  Neurological:  Positive for tingling. Negative for focal weakness and weakness.  All other systems reviewed and are negative.  Otherwise per HPI.  Assessment & Plan: Visit Diagnoses:    ICD-10-CM   1. Chronic right-sided low back pain with right-sided sciatica  M54.41 Ambulatory referral to Neurology   G89.29     2. Radiculopathy, lumbar region  M54.16 Ambulatory referral to Neurology    MR LUMBAR SPINE WO CONTRAST    3. Paresthesia of skin  R20.2 Ambulatory referral to Neurology    4. Morbid (severe) obesity due to excess calories (HCC)  E66.01 Ambulatory referral to Neurology       Plan: Findings:  1. Chronic, worsening and severe right buttock pain radiating to buttock down right leg. Patient continues to have severe pain despite good conservative therapies such as chiropractic treatments, physician directed home exercise regimen, rest and use of medications. He has tried physician directed home exercise regimen and meloxicam  for 6 plus weeks with minor relief of pain. We discussed treatment plan in detail today. Next step is to place order for lumbar MRI imaging. Depending on results of MRI imaging we discussed possibility of performing lumbar epidural steroid injection. No red flag symptoms noted upon exam today.   2. Chronic, worsening and severe paresthesias noted to right hand and foot. Right hand paresthesias do seem to be more of median nerve  distribution, suspect possible carpal tunnel syndrome. He has numbness to right thumb, index and middle fingers. Positive Phalen's and Tinel's sign upon exam today. Unilateral paresthesias to right foot are not typical for polyneuropathy, however we are obtaining lumbar MRI  imaging. Plan is to refer him to neurology for further evaluation and possible nerve studies.     Meds & Orders: No orders of the defined types were placed in this encounter.   Orders Placed This Encounter  Procedures   MR LUMBAR SPINE WO CONTRAST   Ambulatory referral to Neurology    Follow-up: Return for Lumbar MRI review.   Procedures: No procedures performed      Clinical History: No specialty comments available.   He reports that he has quit smoking. His smoking use included cigarettes. He has never been exposed to tobacco smoke. He has never used smokeless tobacco.  Recent Labs    09/01/23 1519  HGBA1C 6.3    Objective:  VS:  HT:    WT:   BMI:     BP:   HR: bpm  TEMP: ( )  RESP:  Physical Exam Vitals and nursing note reviewed.  HENT:     Head: Normocephalic and atraumatic.     Right Ear: External ear normal.     Left Ear: External ear normal.     Nose: Nose normal.     Mouth/Throat:     Mouth: Mucous membranes are moist.  Eyes:     Extraocular Movements: Extraocular movements intact.  Cardiovascular:     Rate and Rhythm: Normal rate.     Pulses: Normal pulses.  Pulmonary:     Effort: Pulmonary effort is normal.  Abdominal:     General: Abdomen is flat. There is no distension.  Musculoskeletal:        General: Tenderness present.     Cervical back: Normal range of motion.     Comments: Patient rises from seated position to standing without difficulty. Good lumbar range of motion. No pain noted with facet loading. 5/5 strength noted with bilateral hip flexion, knee flexion/extension, ankle dorsiflexion/plantarflexion and EHL. No clonus noted bilaterally. No pain upon palpation of greater trochanters. No pain with internal/external rotation of bilateral hips. Sensation intact bilaterally. Negative slump test bilaterally. Ambulates without aid, gait steady. Positive Tinel's sign on the right.    Skin:    General: Skin is warm and dry.     Capillary Refill:  Capillary refill takes less than 2 seconds.  Neurological:     General: No focal deficit present.     Mental Status: He is alert and oriented to person, place, and time.  Psychiatric:        Mood and Affect: Mood normal.        Behavior: Behavior normal.     Ortho Exam  Imaging: No results found.  Past Medical/Family/Surgical/Social History: Medications & Allergies reviewed per EMR, new medications updated. Patient Active Problem List   Diagnosis Date Noted   Chronic low back pain with right-sided sciatica 09/01/2023   Class 3 severe obesity with serious comorbidity and body mass index (BMI) of 45.0 to 49.9 in adult [facility]) 08/02/2023   Lower urinary tract symptoms (LUTS) 08/02/2023   Erectile dysfunction due to diseases classified elsewhere 08/02/2023   Exposure to STD 03/04/2023   Testosterone  deficiency 11/25/2022   History of pulmonary embolus (PE) 11/25/2022   Swelling of both hands 11/25/2022   Family history of bipolar disorder 11/25/2022   Generalized edema worse in lower  extremities 11/25/2022   Concern about mental disorder without diagnosis 11/25/2022   Brain tumor (HCC) 04/05/2022   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/02/2022   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   Diabetes insipidus (HCC) 02/13/2022   Primary hypertension 01/15/2022   Past Medical History:  Diagnosis Date   Diabetes mellitus without complication (HCC)    History of brain surgery 02/2022   History of streptococcal sore throat    Hypertension    Family History  Problem Relation Age of Onset   Bipolar disorder Sister    Past Surgical History:  Procedure Laterality Date   brain tumor removed  02/2022   Social History   Occupational History   Not on file  Tobacco Use   Smoking status: Former    Current packs/day: 0.50    Types: Cigarettes    Passive exposure: Never   Smokeless tobacco: Never   Tobacco comments:    Vaping 3 days a week   Vaping Use    Vaping status: Some Days  Substance and Sexual Activity   Alcohol use: Not Currently   Drug use: Yes    Types: Marijuana   Sexual activity: Not on file

## 2024-01-10 NOTE — Progress Notes (Signed)
 Pain Scale   Average Pain 0 Patient advising his numbness in his right hand and right lower leg keeps him up at night. Patient advising he is not in pain.        +Driver, -BT, -Dye Allergies.

## 2024-01-17 ENCOUNTER — Encounter: Payer: Self-pay | Admitting: Neurology

## 2024-01-21 ENCOUNTER — Ambulatory Visit
Admission: RE | Admit: 2024-01-21 | Discharge: 2024-01-21 | Disposition: A | Discharge status: Home / Self Care | Source: Ambulatory Visit | Attending: Physical Medicine and Rehabilitation | Admitting: Physical Medicine and Rehabilitation

## 2024-02-15 ENCOUNTER — Other Ambulatory Visit: Payer: Self-pay | Admitting: Physical Medicine and Rehabilitation

## 2024-03-05 ENCOUNTER — Encounter (HOSPITAL_COMMUNITY): Payer: Self-pay

## 2024-03-05 ENCOUNTER — Emergency Department (HOSPITAL_COMMUNITY)
Admission: EM | Admit: 2024-03-05 | Discharge: 2024-03-05 | Disposition: A | Discharge status: Home / Self Care | Attending: Emergency Medicine | Admitting: Emergency Medicine

## 2024-03-05 DIAGNOSIS — Z720 Tobacco use: Secondary | ICD-10-CM | POA: Diagnosis not present

## 2024-03-05 DIAGNOSIS — M545 Low back pain, unspecified: Secondary | ICD-10-CM | POA: Insufficient documentation

## 2024-03-05 MED ORDER — LIDOCAINE 5 % EX PTCH
1.0000 | MEDICATED_PATCH | CUTANEOUS | Status: DC
  Administered 2024-03-05: 1 via TRANSDERMAL
  Filled 2024-03-05: qty 1

## 2024-03-05 MED ORDER — LIDOCAINE 5 % EX PTCH: 1.0000 | MEDICATED_PATCH | CUTANEOUS | 0 refills | Status: DC

## 2024-03-05 MED ORDER — CYCLOBENZAPRINE HCL 10 MG PO TABS: 10.0000 mg | ORAL_TABLET | Freq: Two times a day (BID) | ORAL | 0 refills | Status: AC | PRN

## 2024-03-05 MED ORDER — IBUPROFEN 800 MG PO TABS
800.0000 mg | ORAL_TABLET | Freq: Once | ORAL | Status: AC
  Administered 2024-03-05: 800 mg via ORAL
  Filled 2024-03-05: qty 1

## 2024-03-05 NOTE — ED Triage Notes (Signed)
 Pt states that his lower back has been hurting since he went to [employer] on Sat.

## 2024-03-05 NOTE — Discharge Instructions (Addendum)
 Follow-up with your primary care provider and orthopedist for follow-up in regard to your back pain.  Return to the emergency department if your symptoms worsen.  Continue Tylenol  as needed for back pain.  Continue lidocaine  patch applied to the affected area for relief of symptoms.  Continue to apply warm compresses to the affected area.  Start Flexeril , 1 tablet up to twice daily as needed for muscle spasms.

## 2024-03-05 NOTE — ED Provider Notes (Signed)
 [name] Provider Note   CSN: [id number] Arrival date & time: 03/05/24  4098     Patient presents with: Back Pain   Hunter Huynh is a 43 y.o. male.   43 year old male presenting with low back pain.  Patient reports going to top golf Saturday morning and has had soreness in his right lower back since then.  He describes pain worse with sitting/standing, relieved with rest, primarily right-sided, no radiation to his lower extremities.  He has not taken anything for his pain today, tried Tylenol  yesterday with minimal relief.  Patient has history of sciatica, recently had MRI of his lumbar spine in early May.  Denies fevers, loss of bowel/bladder function, new/worsening sensory deficits.  Patient has scheduled appointment with neurology tomorrow as well as his primary care provider on Wednesday.    Back Pain      Prior to Admission medications   Medication Sig Start Date End Date Taking? Authorizing Provider  desmopressin (DDAVP NASAL) 0.01 % solution Place into the nose. 04/02/22   [provider]  desmopressin (DDAVP) 0.01 % SOLN USE 1 DOSE TWICE DAILY . KEEP UPCOMING APPOINTMENT FOR FUTURE REFILLS 12/08/23   [provider]  Elastic Bandages & Supports (MEDICAL COMPRESSION STOCKINGS) MISC 1 each by Does not apply route as needed (Leg swelling). 01/11/23   Hunter Cluck, MD  hydrocortisone (CORTEF) 20 MG tablet Take 20 mg by mouth daily. 03/26/22   [provider]  levothyroxine (SYNTHROID) 150 MCG tablet Take 150 mcg by mouth every morning. 03/27/22   [provider]  meloxicam  (MOBIC ) 15 MG tablet Take 1 tablet by mouth once daily 02/15/24   Huynh, Hunter E, NP  methylPREDNISolone  (MEDROL  DOSEPAK) 4 MG TBPK tablet Take as prescribed 08/26/23   Huynh, [name], MD  naproxen  (NAPROSYN ) 375 MG tablet Take 1 tablet (375 mg total) by mouth 2 (two) times daily. 08/26/23   Huynh, [name], MD  olmesartan   (BENICAR ) 40 MG tablet Take 1 tablet (40 mg total) by mouth daily. 08/02/23 07/27/24  Hunter Cluck, MD  omeprazole (PRILOSEC) 20 MG capsule Take 1 capsule by mouth daily.    [provider]  tadalafil  (CIALIS ) 5 MG tablet Take 1 tablet (5 mg total) by mouth daily. 08/02/23   Hunter Cluck, MD  testosterone  cypionate (DEPOTESTOSTERONE CYPIONATE) 200 MG/ML injection Inject 200 mg into the muscle. 200 mg every 10 days    [provider]  traMADol  (ULTRAM ) 50 MG tablet Take 1 tablet (50 mg total) by mouth every 8 (eight) hours as needed. 09/07/23   Huynh, Hunter E, NP  traMADol  (ULTRAM ) 50 MG tablet Take 1 tablet (50 mg total) by mouth every 8 (eight) hours as needed. 09/30/23 09/29/24  Huynh, Hunter E, NP    Allergies: Patient has no known allergies.    Review of Systems  Musculoskeletal:  Positive for back pain.    Updated Vital Signs Ht 6' 3 (1.905 m)   Wt (!) 161 kg   BMI 44.37 kg/m   Physical Exam Vitals and nursing note reviewed.  HENT:     Head: Normocephalic.   Eyes:     Extraocular Movements: Extraocular movements intact.     Pupils: Pupils are equal, round, and reactive to light.    Cardiovascular:     Rate and Rhythm: Normal rate.  Pulmonary:     Effort: Pulmonary effort is normal.   Musculoskeletal:        General:  Normal range of motion.     Cervical back: Normal range of motion. No tenderness.     Comments: Patient rises from seated to standing position without difficulty No spinous process tenderness to palpation of L-spine/T-spine/C-spine No paralumbar muscle tenderness to palpation 5 out of 5 strength against resistance of bilateral upper and lower extremities Grip strength equal and intact bilaterally   Neurological:     Mental Status: He is alert and oriented to person, place, and time.     Comments: No sensory deficits of bilateral lower extremities    (all labs ordered are listed, but only abnormal results are  displayed) Labs Reviewed - No data to display  EKG: None  Radiology: No results found.   Procedures   Medications Ordered in the ED  ibuprofen  (ADVIL ) tablet 800 mg (has no administration in time range)  lidocaine  (LIDODERM ) 5 % 1 patch (has no administration in time range)                                    Medical Decision Making This patient presents to the ED for concern of back pain, this involves an extensive number of treatment options, and is a complaint that carries with it a high risk of complications and morbidity.  The differential diagnosis includes musculoskeletal pain/strain/sprain, fracture, degenerative disc disease.   Co morbidities that complicate the patient evaluation  Diabetes insipidus, adrenal insufficiency   Additional history obtained:  Additional history obtained from record review External records from outside source obtained and reviewed including orthocare note, PCP notes    Cardiac Monitoring: / EKG:  The patient was maintained on a cardiac monitor.  I personally viewed and interpreted the cardiac monitored which showed an underlying rhythm of: NSR    Problem List / ED Course / Critical interventions / Medication management   I ordered medication including Ibuprofen  and  lidocaine  patch for back pain  Reevaluation of the patient after these medicines showed that the patient stayed the same I have reviewed the patients home medicines and have made adjustments as needed   Social Determinants of Health:  Tobacco use   Test / Admission - Considered:  Physical exam is largely reassuring, patient is not demonstrating bony spinous tenderness to palpation nor is he exhibiting any sensory deficits in his lower extremities, pain is atraumatic in nature and I do not feel that patient would benefit from additional lab/imaging at this time as I believe that his symptoms are likely muscular in nature.  Patient is followed by his primary care  provider/Ortho care/neurology for known chronic back pain, has appointment with neurology tomorrow and primary care on Wednesday, encouraged patient to keep these appointments..  Patient has recently undergone MRI imaging of his lumbar spine.  Will prescribe Flexeril  and lidocaine  patches to be used for back pain, cautioned patient that Flexeril  can cause drowsiness and to avoid operating heavy machinery while taking Flexeril .  He can continue Tylenol  and warm compresses for back pain, I have provided educational material for exercises/stretches that can relieve pain.  Patient is in agreement with this plan and voices understanding.  Return precautions discussed.  Patient is appropriate for discharge at this time.    Risk Prescription drug management.        Final diagnoses:  Right-sided low back pain without sciatica, unspecified chronicity    ED Discharge Orders  Ordered    cyclobenzaprine  (FLEXERIL ) 10 MG tablet  2 times daily PRN        03/05/24 0721    lidocaine  (LIDODERM ) 5 %  Every 24 hours        03/05/24 0721               Kendrick Pax, PA-C 03/05/24 1324    Trish Furl, MD 03/06/24 1028

## 2024-03-06 ENCOUNTER — Ambulatory Visit (INDEPENDENT_AMBULATORY_CARE_PROVIDER_SITE_OTHER): Admitting: Neurology

## 2024-03-06 ENCOUNTER — Encounter: Payer: Self-pay | Admitting: Neurology

## 2024-03-06 VITALS — BP 158/114 | HR 112 | Ht 75.0 in | Wt 350.0 lb

## 2024-03-06 DIAGNOSIS — R202 Paresthesia of skin: Secondary | ICD-10-CM | POA: Diagnosis not present

## 2024-03-06 DIAGNOSIS — M5416 Radiculopathy, lumbar region: Secondary | ICD-10-CM | POA: Diagnosis not present

## 2024-03-06 MED ORDER — GABAPENTIN 300 MG PO CAPS: 300.0000 mg | ORAL_CAPSULE | Freq: Every day | ORAL | 2 refills | Status: AC

## 2024-03-06 MED ORDER — METHYLPREDNISOLONE 4 MG PO TBPK: ORAL_TABLET | ORAL | 0 refills | Status: DC

## 2024-03-06 NOTE — Progress Notes (Signed)
 [facility] Neurology Division Clinic Note - Initial Visit   Date: 03/06/2024   Hunter Huynh MRN: [id number] DOB: 26-Dec-1980   Dear Dr. Hildy Lowers:  Thank you for your kind referral of Hunter Huynh for consultation of right leg pain and hand paresthesia. Although his history is well known to you, please allow us  to reiterate it for the purpose of our medical record. The patient was accompanied to the clinic by self.   Hunter Huynh is a 43 y.o. right-handed male with history of pituitary tumor s/p resection, diabetes, hypertension,  presenting for evaluation of right leg pain and right hand paresthesias.   IMPRESSION/PLAN: Right hand paresthesias, most likely carpal tunnel syndrome.  Tinel's sign is mildly positive at the right wrist.  - NCS/EMG of the right arm  2.  Right leg pain is most suggestive of lumbar radiculopathy.  MRI lumbar spine shows lateral disc protrusion at L4-5 and L5-S1 which causes lateral recess narrowing and impingement of the right L5 and S1 nerve.   - PT for low back strengthening declined  - Start medrol  dosepak   - Start gabapentin 300mg  at bedtime  - He is on flexeril  10mg  at bedtime as needed  - Follow-up with spine orthopeadics for ESI, if no improvement  Return to clinic in 4 months  ------------------------------------------------------------- History of present illness: For at least the past 10 years, he has episodic pain involving the right leg.  Over the past several months, he began having worsening pain starting in the right buttocks radiating down the right leg.  He has numbness over the top of the toes on the right.   He has previously seen orthopeadics, chiropractor, and tried home exercises with no improvement.  MRI lumbar spine from May 2025 shows disc protrusion at L4-5 compressing the right L5 and S1 nerve foot.  About 10 years ago, he had improvement with ESI.  He has not has ESI or oral steroids this year.    He denies  weakness, falls, or imbalance.   He also complains of right hand numbness/tingling. It is worse at night time and involves the thumb, index finger, and middle finger.  He has noticed that grip is weaker on that side. He plays a lot of video games and feels this may be contributing.   He works as a Air cabin crew. He smokes marijuana and has strong odor of this today.      Out-side paper records, electronic medical record, and images have been reviewed where available and summarized as:  MRI lumbar spine wo contrast 03/06/2024: Degenerative changes of the lumbar spine as above. Disc protrusion at L4-5 resulting in lateral recess narrowing with possible impingement upon the traversing right L5 nerve roots. Additional disc bulge at L5-S1 resulting in lateral recess narrowing and possible impingement upon the traversing right S1 nerve roots.   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 Additional moderate to severe foraminal stenosis on the right at L4-5.    Lab Results  Component Value Date   HGBA1C 6.3 09/01/2023   No results found for: VITAMINB12 Lab Results  Component Value Date   TSH 0.02 (L) 11/25/2022   No results found for: ESRSEDRATE, POCTSEDRATE  Past Medical History:  Diagnosis Date   Diabetes mellitus without complication (HCC)    History of brain surgery 02/2022   History of streptococcal sore throat    Hypertension     Past Surgical History:  Procedure Laterality Date  brain tumor removed  02/2022     Medications:  Outpatient Encounter Medications as of 03/06/2024  Medication Sig   cyclobenzaprine  (FLEXERIL ) 10 MG tablet Take 1 tablet (10 mg total) by mouth 2 (two) times daily as needed for muscle spasms.   desmopressin (DDAVP NASAL) 0.01 % solution Place into the nose.   desmopressin (DDAVP) 0.01 % SOLN USE 1 DOSE TWICE DAILY . KEEP UPCOMING APPOINTMENT FOR FUTURE REFILLS   Elastic Bandages & Supports (MEDICAL COMPRESSION  STOCKINGS) MISC 1 each by Does not apply route as needed (Leg swelling).   gabapentin (NEURONTIN) 300 MG capsule Take 1 capsule (300 mg total) by mouth at bedtime.   hydrocortisone (CORTEF) 20 MG tablet Take 20 mg by mouth daily.   levothyroxine (SYNTHROID) 150 MCG tablet Take 150 mcg by mouth every morning.   lidocaine  (LIDODERM ) 5 % Place 1 patch onto the skin daily. Remove & Discard patch within 12 hours or as directed by MD   meloxicam  (MOBIC ) 15 MG tablet Take 1 tablet by mouth once daily   olmesartan  (BENICAR ) 40 MG tablet Take 1 tablet (40 mg total) by mouth daily.   omeprazole (PRILOSEC) 20 MG capsule Take 1 capsule by mouth daily.   tadalafil  (CIALIS ) 5 MG tablet Take 1 tablet (5 mg total) by mouth daily.   testosterone  cypionate (DEPOTESTOSTERONE CYPIONATE) 200 MG/ML injection Inject 200 mg into the muscle. 200 mg every 10 days   methylPREDNISolone  (MEDROL  DOSEPAK) 4 MG TBPK tablet Take as prescribed   naproxen  (NAPROSYN ) 375 MG tablet Take 1 tablet (375 mg total) by mouth 2 (two) times daily. (Patient not taking: Reported on 03/06/2024)   traMADol  (ULTRAM ) 50 MG tablet Take 1 tablet (50 mg total) by mouth every 8 (eight) hours as needed. (Patient not taking: Reported on 03/06/2024)   traMADol  (ULTRAM ) 50 MG tablet Take 1 tablet (50 mg total) by mouth every 8 (eight) hours as needed. (Patient not taking: Reported on 03/06/2024)   [DISCONTINUED] methylPREDNISolone  (MEDROL  DOSEPAK) 4 MG TBPK tablet Take as prescribed (Patient not taking: Reported on 03/06/2024)   No facility-administered encounter medications on file as of 03/06/2024.    Allergies: No Known Allergies  Family History: Family History  Problem Relation Age of Onset   Hypertension Mother    Asthma Father    Bipolar disorder Sister     Social History: Social History   Tobacco Use   Smoking status: Former    Current packs/day: 0.50    Types: Cigarettes    Passive exposure: Never   Smokeless tobacco: Never   Tobacco  comments:    Vaping 3 days a week   Vaping Use   Vaping status: Some Days  Substance Use Topics   Alcohol use: Not Currently   Drug use: Yes    Types: Marijuana   Social History   Social History Narrative   Are you right handed or left handed? Right Handed    Are you currently employed ? Yes   What is your current occupation?   Do you live at home alone? No    Who lives with you? Wife and son    What type of home do you live in: 1 story or 2 story? Lives in a one story floor plan apartment. Has to climb stairs to his apartment.         Vital Signs:  BP (!) 158/114   Pulse (!) 112   Ht 6' 3 (1.905 m)   Wt (!) 350 lb (158.8  kg)   SpO2 95%   BMI 43.75 kg/m     Neurological Exam: MENTAL STATUS including orientation to time, place, person, recent and remote memory, attention span and concentration, language, and fund of knowledge is normal.  Speech is not dysarthric.  CRANIAL NERVES: II:  No visual field defects.     III-IV-VI: Pupils equal round and reactive to light.  Normal conjugate, extra-ocular eye movements in all directions of gaze.  No nystagmus.  No ptosis.   V:  Normal facial sensation.    VII:  Normal facial symmetry and movements.   VIII:  Normal hearing and vestibular function.   IX-X:  Normal palatal movement.   XI:  Normal shoulder shrug and head rotation.   XII:  Normal tongue strength and range of motion, no deviation or fasciculation.  MOTOR:  No atrophy, fasciculations or abnormal movements.  No pronator drift.   Upper Extremity:  Right  Left  Deltoid  5/5   5/5   Biceps  5/5   5/5   Triceps  5/5   5/5   Wrist extensors  5/5   5/5   Wrist flexors  5/5   5/5   Finger extensors  5/5   5/5   Finger flexors  5/5   5/5   Dorsal interossei  5/5   5/5   Abductor pollicis  5/5   5/5   Tone (Ashworth scale)  0  0   Lower Extremity:  Right  Left  Hip flexors  5/5   5/5   Knee flexors  5/5   5/5   Knee extensors  5/5   5/5   Dorsiflexors  5/5   5/5    Plantarflexors  5/5   5/5   Toe extensors  5/5   5/5   Toe flexors  5/5   5/5   Tone (Ashworth scale)  0  0   MSRs:                                           Right        Left brachioradialis 2+  2+  biceps 2+  2+  triceps 2+  2+  patellar 1+  2+  ankle jerk 1+  2+  Hoffman no  no  plantar response down  down   SENSORY:  Normal and symmetric perception of light touch, vibration, and temperature.    COORDINATION/GAIT: Normal finger-to- nose-finger.  Intact rapid alternating movements bilaterally. Gait is wide-based, stable, unassisted.   Thank you for allowing me to participate in patient's care.  If I can answer any additional questions, I would be pleased to do so.    Sincerely,    Aryia Delira K. Lydia Sams, DO

## 2024-03-06 NOTE — Patient Instructions (Addendum)
 Nerve testing of the right arm  Start medrol  dose pack   Take gabapentin 300mg  at bedtime  ELECTROMYOGRAM AND NERVE CONDUCTION STUDIES (EMG/NCS) INSTRUCTIONS  How to Prepare The neurologist conducting the EMG will need to know if you have certain medical conditions. Tell the neurologist and other EMG lab personnel if you: Have a pacemaker or any other electrical medical device Take blood-thinning medications Have hemophilia, a blood-clotting disorder that causes prolonged bleeding Bathing Take a shower or bath shortly before your exam in order to remove oils from your skin. Don't apply lotions or creams before the exam.  What to Expect You'll likely be asked to change into a hospital gown for the procedure and lie down on an examination table. The following explanations can help you understand what will happen during the exam.  Electrodes. The neurologist or a technician places surface electrodes at various locations on your skin depending on where you're experiencing symptoms. Or the neurologist may insert needle electrodes at different sites depending on your symptoms.  Sensations. The electrodes will at times transmit a tiny electrical current that you may feel as a twinge or spasm. The needle electrode may cause discomfort or pain that usually ends shortly after the needle is removed. If you are concerned about discomfort or pain, you may want to talk to the neurologist about taking a short break during the exam.  Instructions. During the needle EMG, the neurologist will assess whether there is any spontaneous electrical activity when the muscle is at rest - activity that isn't present in healthy muscle tissue - and the degree of activity when you slightly contract the muscle.  He or she will give you instructions on resting and contracting a muscle at appropriate times. Depending on what muscles and nerves the neurologist is examining, he or she may ask you to change positions during the  exam.  After your EMG You may experience some temporary, minor bruising where the needle electrode was inserted into your muscle. This bruising should fade within several days. If it persists, contact your primary care doctor.

## 2024-03-16 ENCOUNTER — Other Ambulatory Visit: Payer: Self-pay | Admitting: Physical Medicine and Rehabilitation

## 2024-03-27 ENCOUNTER — Ambulatory Visit (INDEPENDENT_AMBULATORY_CARE_PROVIDER_SITE_OTHER): Admitting: Physical Medicine and Rehabilitation

## 2024-03-27 ENCOUNTER — Encounter: Payer: Self-pay | Admitting: Physical Medicine and Rehabilitation

## 2024-03-27 DIAGNOSIS — M5441 Lumbago with sciatica, right side: Secondary | ICD-10-CM

## 2024-03-27 DIAGNOSIS — M5416 Radiculopathy, lumbar region: Secondary | ICD-10-CM

## 2024-03-27 DIAGNOSIS — R202 Paresthesia of skin: Secondary | ICD-10-CM | POA: Diagnosis not present

## 2024-03-27 DIAGNOSIS — G8929 Other chronic pain: Secondary | ICD-10-CM

## 2024-03-27 NOTE — Progress Notes (Unsigned)
 Pain Scale   Average Pain 2 Patient advising he has chronic lower back pain, patient advising his pain is in the am when he get up and after he takes his meds he is able to function without problems        +Driver, -BT, -Dye Allergies.

## 2024-03-27 NOTE — Progress Notes (Unsigned)
 Hunter Huynh - 43 y.o. male MRN [id number]  Date of birth: 02-Jul-1981  Office Visit Note: Visit Date: 03/27/2024 PCP: Sebastian Beverley NOVAK, MD Referred by: Sebastian Beverley NOVAK, MD  Subjective: Chief Complaint  Patient presents with   Lower Back - Follow-up   HPI: Hunter Huynh is a 43 y.o. male who comes in today for evaluation of chronic bilateral lower back pain. Intermittent pain radiating down right buttock and leg. Pain ongoing for several years, worsens with standing and activity. He describes pain as sore and aching sensation, currently rates as 1 out of 10. His pain has improved dramatically since our last office visit in April. He contributes his improvement in pain to using heating pad intermittently. States he will experience flare ups after strenuous activity, but typically resolves within a few days. He has not experienced right sided radicular symptoms in several weeks. Some relief of pain with home exercise regimen, rest and use of medications. History of formal chiropractic treatments several years ago with good relief of pain.   Recent lumbar MRI imaging shows disc protrusion at L4-L5 resulting in lateral recess narrowing with possible impingement upon the traversing right L5 nerve roots. There is disc bulge at L5-S1 resulting in lateral recess narrowing and possible impingement upon the traversing right S1 nerve roots. Prior lumbar MRI imaging from 2014 did show large disc extrusion with free fragment extending downward on the right. This appears to have improved over time. History of lumbar epidural steroid injection in 2014 with significant relief of pain.   Also reports numbness/tingling to right hand and right foot. He continues to have numbness to right thumb, index and middle fingers. He was recently evaluated by Dr. Tonita Blanch with [facility]. She is planning for right upper extremity nerve study, per her note she feels this is likely carpal tunnel syndrome. She  did prescribe Gabapentin  300 mg at bedtime, however he has not yet started.   Patients course is complicated by diabetes insipidus, history of pituitary tumor requiring craniotomy, morbid obesity, chronic swelling to bilateral lower extremities.     Review of Systems  Musculoskeletal:  Positive for back pain.  Neurological:  Positive for tingling.  All other systems reviewed and are negative.  Otherwise per HPI.  Assessment & Plan: Visit Diagnoses:    ICD-10-CM   1. Chronic right-sided low back pain with right-sided sciatica  M54.41    G89.29     2. Radiculopathy, lumbar region  M54.16     3. Paresthesia of skin  R20.2     4. Morbid (severe) obesity due to excess calories (HCC)  E66.01        Plan: Findings:  1. Chronic bilateral lower back pain. Intermittent pain radiating to right buttocks and down right leg. His pain has significantly improved over the last month. He continues with home exercise regimen, rest and use of medications. Patients clinical presentation and exam are consistent with lumbar radiculopathy, more of L5 and S1 nerve patterns. I discussed recent lumbar MRI with patient today using imaging and spine model. There is disc protrusion at L4-L5 resulting in lateral recess narrowing and possibly causing L5 nerve impingement. Also disc bulge at L5-S1 causing lateral recess narrowing and possible S1 nerve impingement. There is multilevel foraminal stenosis, greatest and severe on the right at L5-S1. We discussed treatment options today. At this point, would recommend holding on interventional spine procedures as his pain is mild today. Should his pain return and stay with him  would recommend lumbar epidural steroid injection. I encouraged patient to remain active, he can continue with use of heating pad and medications. I also encouraged him to start Gabapentin  at bedtime. No red flag symptoms noted upon exam today.   2. Chronic paresthesias to right hand and foot. He has  upcoming appointment with Dr. Tobie for right upper extremity nerve study. Likely carpal tunnel syndrome.      Meds & Orders: No orders of the defined types were placed in this encounter.  No orders of the defined types were placed in this encounter.   Follow-up: Return if symptoms worsen or fail to improve.   Procedures: No procedures performed      Clinical History: CLINICAL DATA:  Lower back pain, 1 year with numbness in toes.   EXAM: MRI LUMBAR SPINE WITHOUT CONTRAST   TECHNIQUE: Multiplanar, multisequence MR imaging of the lumbar spine was performed. No intravenous contrast was administered.   COMPARISON:  MRI lumbar spine 05/12/2013.   FINDINGS: Segmentation: For the purposes of this dictation there are presumed to be 5 non-rib-bearing lumbar type vertebral bodies. The lowest well-formed disc space is labeled L5-S1.   Alignment: Lumbar lordosis is maintained. No significant listhesis.   Vertebrae: Vertebral body heights are maintained. No bone marrow edema or evidence of fracture. Modic type 2 degenerative endplate changes anteriorly at L3-4 and L5-S1. No suspicious osseous lesion.   Conus medullaris and cauda equina: Conus extends to the L1 level. Conus and cauda equina appear normal.   Paraspinal and other soft tissues: The paraspinal soft tissues are unremarkable.   Disc levels:   T12-L1: Small disc bulge which indents the ventral thecal sac without significant spinal canal stenosis. No significant foraminal stenosis.   L1-2: No significant disc bulge. No significant spinal canal stenosis or foraminal stenosis. Mild facet arthrosis.   L2-3: Disc desiccation and mild disc height loss. Diffuse disc bulge eccentric to the left without significant spinal canal stenosis. Small left foraminal disc protrusion. Bilateral facet arthrosis. No significant spinal canal stenosis. No significant foraminal stenosis.   L3-4: Disc desiccation and moderate disc height  loss. Disc bulge resulting in mild lateral recess narrowing slightly greater on the left without significant nerve root impingement. Mild facet arthrosis. No significant spinal canal stenosis. There is mild-to-moderate bilateral foraminal stenosis.   L4-5: Disc desiccation and mild disc height loss. Right subarticular disc protrusion. Lateral recess narrowing greater on the right. There is possible impingement upon the traversing right L5 nerve roots. Moderate facet arthrosis indents the dorsal thecal sac. No significant spinal canal stenosis. There is moderate to severe right and mild left foraminal stenosis.   L5-S1: Disc desiccation and moderate disc height loss. Diffuse disc bulge eccentric to the right with lateral recess narrowing greater on the right. Possible impingement upon the traversing right S1 nerve roots. Right foraminal disc protrusion. Bilateral facet arthrosis. No significant spinal canal stenosis. There is severe right and mild left foraminal stenosis.   IMPRESSION: Degenerative changes of the lumbar spine as above. Disc protrusion at L4-5 resulting in lateral recess narrowing with possible impingement upon the traversing right L5 nerve roots. Additional disc bulge at L5-S1 resulting in lateral recess narrowing and possible impingement upon the traversing right S1 nerve roots.   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 Additional moderate to severe foraminal stenosis on the right at L4-5.     Electronically Signed   By: Donnice Mania M.D.   On: 03/06/2024 15:12  He reports that he has quit smoking. His smoking use included cigarettes. He has never been exposed to tobacco smoke. He has never used smokeless tobacco.  Recent Labs    09/01/23 1519  HGBA1C 6.3    Objective:  VS:  HT:    WT:   BMI:     BP:   HR: bpm  TEMP: ( )  RESP:  Physical Exam Vitals and nursing note reviewed.  HENT:     Head: Normocephalic and  atraumatic.     Right Ear: External ear normal.     Left Ear: External ear normal.     Nose: Nose normal.     Mouth/Throat:     Mouth: Mucous membranes are moist.  Eyes:     Extraocular Movements: Extraocular movements intact.  Cardiovascular:     Rate and Rhythm: Normal rate.     Pulses: Normal pulses.  Pulmonary:     Effort: Pulmonary effort is normal.  Abdominal:     General: Abdomen is flat. There is no distension.  Musculoskeletal:        General: Tenderness present.     Cervical back: Normal range of motion.     Comments: Patient rises from seated position to standing without difficulty. Good lumbar range of motion. No pain noted with facet loading. 5/5 strength noted with bilateral hip flexion, knee flexion/extension, ankle dorsiflexion/plantarflexion and EHL. No clonus noted bilaterally. No pain upon palpation of greater trochanters. No pain with internal/external rotation of bilateral hips. Sensation intact bilaterally. Negative slump test bilaterally. Ambulates without aid, gait steady. Positive Tinel's sign on the right.  Skin:    General: Skin is warm and dry.     Capillary Refill: Capillary refill takes less than 2 seconds.  Neurological:     General: No focal deficit present.     Mental Status: He is alert and oriented to person, place, and time.  Psychiatric:        Mood and Affect: Mood normal.        Behavior: Behavior normal.     Ortho Exam  Imaging: No results found.  Past Medical/Family/Surgical/Social History: Medications & Allergies reviewed per EMR, new medications updated. Patient Active Problem List   Diagnosis Date Noted   Chronic low back pain with right-sided sciatica 09/01/2023   Class 3 severe obesity with serious comorbidity and body mass index (BMI) of 45.0 to 49.9 in adult 08/02/2023   Lower urinary tract symptoms (LUTS) 08/02/2023   Erectile dysfunction due to diseases classified elsewhere 08/02/2023   Exposure to STD 03/04/2023    Testosterone  deficiency 11/25/2022   History of pulmonary embolus (PE) 11/25/2022   Swelling of both hands 11/25/2022   Family history of bipolar disorder 11/25/2022   Generalized edema worse in lower extremities 11/25/2022   Concern about mental disorder without diagnosis 11/25/2022   Brain tumor (HCC) 04/05/2022   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/02/2022   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   Diabetes insipidus (HCC) 02/13/2022   Primary hypertension 01/15/2022   Past Medical History:  Diagnosis Date   Diabetes mellitus without complication (HCC)    History of brain surgery 02/2022   History of streptococcal sore throat    Hypertension    Family History  Problem Relation Age of Onset   Hypertension Mother    Asthma Father    Bipolar disorder Sister    Past Surgical History:  Procedure Laterality Date   brain tumor removed  02/2022   Social History  Occupational History   Not on file  Tobacco Use   Smoking status: Former    Current packs/day: 0.50    Types: Cigarettes    Passive exposure: Never   Smokeless tobacco: Never   Tobacco comments:    Vaping 3 days a week   Vaping Use   Vaping status: Some Days  Substance and Sexual Activity   Alcohol use: Not Currently   Drug use: Yes    Types: Marijuana   Sexual activity: Not on file

## 2024-03-30 ENCOUNTER — Other Ambulatory Visit: Payer: Self-pay

## 2024-03-30 DIAGNOSIS — R202 Paresthesia of skin: Secondary | ICD-10-CM

## 2024-04-14 ENCOUNTER — Other Ambulatory Visit: Payer: Self-pay | Admitting: Physical Medicine and Rehabilitation

## 2024-04-19 ENCOUNTER — Ambulatory Visit (INDEPENDENT_AMBULATORY_CARE_PROVIDER_SITE_OTHER): Admitting: Neurology

## 2024-04-19 DIAGNOSIS — R202 Paresthesia of skin: Secondary | ICD-10-CM

## 2024-04-19 DIAGNOSIS — G5601 Carpal tunnel syndrome, right upper limb: Secondary | ICD-10-CM

## 2024-04-19 NOTE — Procedures (Signed)
  [facility]  [address] [place], [address]  [place], [place] [address] Tel: [phone] Fax: [phone] Test Date:  04/19/2024  Patient: Hunter Huynh DOB: 08-Nov-1980 Physician: Tonita Blanch, DO  Sex: Male Height: 6' 3 Ref Phys: Tonita Blanch, DO  ID#: [id number]   Technician:    History: This is a 43 year old man referred for evaluation of right hand paresthesias and pain.  NCV & EMG Findings: Extensive electrodiagnostic testing of the right upper extremity shows:  Right median sensory response shows prolonged latency (4.5 ms) and reduced amplitude (6.8 V).  Right ulnar sensory response is within normal limits. Right median motor response shows prolonged latency (4.1 ms).  Right ulnar motor response is within normal limits.   Normal EMG.  The right median sensory nerve showed prolonged distal peak All remaining nerves (as indicated in the following tables) were within normal limits.    Impression: Right median neuropathy at or distal to the wrist, consistent with a clinical diagnosis of carpal tunnel syndrome.  Overall, these findings are moderate in degree electrically.   ___________________________ Tonita Blanch, DO    Nerve Conduction Studies   Stim Site NR Peak (ms) Norm Peak (ms) O-P Amp (V) Norm O-P Amp  Right Median Anti Sensory (2nd Digit)  32 C  Wrist    *4.5 <3.4 *6.8 >20  Right Ulnar Anti Sensory (5th Digit)  32 C  Wrist    3.1 <3.1 21.2 >12     Stim Site NR Onset (ms) Norm Onset (ms) O-P Amp (mV) Norm O-P Amp Site1 Site2 Delta-0 (ms) Dist (cm) Vel (m/s) Norm Vel (m/s)  Right Median Motor (Abd Poll Brev)  32 C  Wrist    *4.1 <3.9 8.5 >6 Elbow Wrist 6.0 35.0 58 >50  Elbow    10.1  7.5         Right Ulnar Motor (Abd Dig Minimi)  32 C  Wrist    2.6 <3.1 10.8 >7 B Elbow Wrist 4.7 26.0 55 >50  B Elbow    7.3  10.4  A Elbow B Elbow 1.6 10.0 62 >50  A Elbow    8.9  10.3          Electromyography   Side Muscle Ins.Act Fibs Fasc Recrt Amp Dur  Poly Activation Comment  Right 1stDorInt Nml Nml Nml Nml Nml Nml Nml Nml N/A  Right Abd Poll Brev Nml Nml Nml Nml Nml Nml Nml Nml N/A  Right PronatorTeres Nml Nml Nml Nml Nml Nml Nml Nml N/A  Right Biceps Nml Nml Nml Nml Nml Nml Nml Nml N/A  Right Triceps Nml Nml Nml Nml Nml Nml Nml Nml N/A  Right Deltoid Nml Nml Nml Nml Nml Nml Nml Nml N/A      Waveforms:

## 2024-04-23 NOTE — H&P (View-Only) (Signed)
 Referring Physician:  Sebastian Beverley NOVAK, MD [address] [place],  [place] [address]  Primary Physician:  Sebastian Beverley NOVAK, MD  History of Present Illness: 04/25/2024 [name] is here today with a chief complaint of right sided carpal tunnel syndrome.  He has been having the symptoms since approximately January.  He gets right hand paresthesias.  This is worse when he is sleeping, he often wake up and shake out his hand.  He also gets numbness and tingling while he is playing video games or using his hand on his cell phone or reading.  He has tried bracing, he has not tried injections.  He has tried lifestyle modifications.  He is not having any significant improvement with those.  He is gotten a recent EMG nerve conduction study with Dr. Tobie there were found to have moderate degree carpal tunnel syndrome and was referred for decompression  Review of Systems:  A 10 point review of systems is negative, except for the pertinent positives and negatives detailed in the HPI.  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 KEEP UPCOMING APPOINTMENT FOR FUTURE REFILLS, Disp: , Rfl:    Elastic Bandages & Supports (MEDICAL COMPRESSION STOCKINGS) MISC, 1 each by Does not apply route as needed (Leg  swelling)., Disp: 1 each, Rfl: 0   gabapentin  (NEURONTIN ) 300 MG capsule, Take 1 capsule (300 mg total) by mouth at bedtime., Disp: 30 capsule, Rfl: 2   hydrocortisone (CORTEF) 20 MG tablet, Take 20 mg by mouth daily., Disp: , Rfl:    levothyroxine (SYNTHROID) 150 MCG tablet, Take 150 mcg by mouth every morning., Disp: , Rfl:    lidocaine  (LIDODERM ) 5 %, Place 1 patch onto the skin daily. Remove & Discard patch within 12 hours or as directed by MD, Disp: 10 patch, Rfl: 0   meloxicam  (MOBIC ) 15 MG tablet, Take 1 tablet by mouth once daily, Disp: 30 tablet, Rfl: 0   methylPREDNISolone  (MEDROL  DOSEPAK) 4 MG TBPK tablet, Take as prescribed, Disp: 21 each, Rfl: 0   naproxen  (NAPROSYN ) 375 MG tablet, Take 1 tablet (375 mg total) by mouth 2 (two) times daily. (Patient not taking: Reported on 03/06/2024), Disp: 20 tablet, Rfl: 0   olmesartan  (BENICAR ) 40 MG tablet, Take 1 tablet (40 mg total) by mouth daily., Disp: 90 tablet, Rfl: 3   omeprazole (PRILOSEC) 20 MG capsule, Take 1 capsule by mouth daily., Disp: , Rfl:    tadalafil  (CIALIS ) 5 MG tablet, Take 1 tablet (5 mg total) by mouth daily., Disp: 30 tablet, Rfl: 11   testosterone  cypionate (DEPOTESTOSTERONE CYPIONATE) 200 MG/ML injection, Inject 200 mg into the muscle. 200 mg every 10 days, Disp: , Rfl:    traMADol  (ULTRAM ) 50 MG tablet, Take 1  tablet (50 mg total) by mouth every 8 (eight) hours as needed. (Patient not taking: Reported on 03/06/2024), Disp: 20 tablet, Rfl: 0   traMADol  (ULTRAM ) 50 MG tablet, Take 1 tablet (50 mg total) by mouth every 8 (eight) hours as needed. (Patient not taking: Reported on 03/06/2024), Disp: 20 tablet, Rfl: 0  Social History: Social History   Tobacco Use   Smoking status: Former    Current packs/day: 0.50    Types: Cigarettes    Passive exposure: Never   Smokeless tobacco: Never   Tobacco comments:    Vaping 3 days a week   Vaping Use   Vaping status: Some Days  Substance Use Topics   Alcohol use: Not  Currently   Drug use: Yes    Types: Marijuana    Family Medical History: Family History  Problem Relation Age of Onset   Hypertension Mother    Asthma Father    Bipolar disorder Sister     Physical Examination: There were no vitals filed for this visit.  General: Patient is in no apparent distress. Attention to examination is appropriate.  Neck:   Supple.  Full range of motion.  Respiratory: Patient is breathing without any difficulty.   NEUROLOGICAL:     Awake, alert, oriented to person, place, and time.  Speech is clear and fluent.   Cranial Nerves: Pupils equal round and reactive to light.  Facial tone is symmetric. Shoulder shrug is symmetric. Tongue protrusion is midline.  There is no pronator drift.  Motor Exam:  He has good strength in his median innervated musculature.  He has a positive Tinel sign, positive carpal compression, positive Phalen  Slight decrease in his median distribution on the right   Medical Decision Making  Imaging: No relevant imaging  Electrodiagnostics:  [facility]  [address] [place], [address]  [place], [place] [address] Tel: [phone] Fax: [phone] Test Date:  04/19/2024   Patient: Hunter Huynh DOB: [birth year] Physician: Tonita Blanch, DO  Sex: Male Height: 6' 3 Ref Phys: Tonita Blanch, DO  ID#: [id number]     Technician:      History: This is a 43 year old man referred for evaluation of right hand paresthesias and pain.   NCV & EMG Findings: Extensive electrodiagnostic testing of the right upper extremity shows:  Right median sensory response shows prolonged latency (4.5 ms) and reduced amplitude (6.8 V).  Right ulnar sensory response is within normal limits. Right median motor response shows prolonged latency (4.1 ms).  Right ulnar motor response is within normal limits.   Normal EMG.  The right median sensory nerve showed prolonged distal peak All remaining nerves (as indicated in the following tables)  were within normal limits.     Impression: Right median neuropathy at or distal to the wrist, consistent with a clinical diagnosis of carpal tunnel syndrome.  Overall, these findings are moderate in degree electrically.     ___________________________ Tonita Blanch, DO  I have personally reviewed the images and electrodiagnostics and agree with the above interpretation.  Assessment and Plan: Mr. Georg is a pleasant 43 y.o. male with symptoms of carpal tunnel syndrome for the last 8 months.  He has been treated conservatively.  Has tried bracing.  Lifestyle modification.  Continues to have symptoms consistent with median neuropathy at the wrist.  This wakes him from nighttime.  He also gets daytime symptoms when he is using his videogames or using his hands.  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  Continues to have bad symptoms went to see neurology for nerve testing found to have moderate degree carpal tunnel syndrome on the right.  Given the symptoms and his ongoing symptoms for over 8 months, impact on his quality of life, impact on his activities of daily living, moderate degree on his testing we have offered a carpal tunnel decompression.  We discussed carpal tunnel decompression with ultrasound guidance.  He would like to go forward with the procedure.  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  He will plan on reaching out to us  once he knows his class schedule and we will plan for a carpal tunnel decompression on the right with ultrasound guidance.  Penne MICAEL Sharps MD/MSCR Neurosurgery - Peripheral Nerve Surgery

## 2024-04-23 NOTE — Progress Notes (Unsigned)
 Referring Physician:  Sebastian Beverley NOVAK, MD [address] [place],  [place] [address]  Primary Physician:  Sebastian Beverley NOVAK, MD  History of Present Illness: 04/25/2024 [name] is here today with a chief complaint of right sided carpal tunnel syndrome.  He has been having the symptoms since approximately January.  He gets right hand paresthesias.  This is worse when he is sleeping, he often wake up and shake out his hand.  He also gets numbness and tingling while he is playing video games or using his hand on his cell phone or reading.  He has tried bracing, he has not tried injections.  He has tried lifestyle modifications.  He is not having any significant improvement with those.  He is gotten a recent EMG nerve conduction study with Dr. Tobie there were found to have moderate degree carpal tunnel syndrome and was referred for decompression  Review of Systems:  A 10 point review of systems is negative, except for the pertinent positives and negatives detailed in the HPI.  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 KEEP UPCOMING APPOINTMENT FOR FUTURE REFILLS, Disp: , Rfl:    Elastic Bandages & Supports (MEDICAL COMPRESSION STOCKINGS) MISC, 1 each by Does not apply route as needed (Leg  swelling)., Disp: 1 each, Rfl: 0   gabapentin  (NEURONTIN ) 300 MG capsule, Take 1 capsule (300 mg total) by mouth at bedtime., Disp: 30 capsule, Rfl: 2   hydrocortisone (CORTEF) 20 MG tablet, Take 20 mg by mouth daily., Disp: , Rfl:    levothyroxine (SYNTHROID) 150 MCG tablet, Take 150 mcg by mouth every morning., Disp: , Rfl:    lidocaine  (LIDODERM ) 5 %, Place 1 patch onto the skin daily. Remove & Discard patch within 12 hours or as directed by MD, Disp: 10 patch, Rfl: 0   meloxicam  (MOBIC ) 15 MG tablet, Take 1 tablet by mouth once daily, Disp: 30 tablet, Rfl: 0   methylPREDNISolone  (MEDROL  DOSEPAK) 4 MG TBPK tablet, Take as prescribed, Disp: 21 each, Rfl: 0   naproxen  (NAPROSYN ) 375 MG tablet, Take 1 tablet (375 mg total) by mouth 2 (two) times daily. (Patient not taking: Reported on 03/06/2024), Disp: 20 tablet, Rfl: 0   olmesartan  (BENICAR ) 40 MG tablet, Take 1 tablet (40 mg total) by mouth daily., Disp: 90 tablet, Rfl: 3   omeprazole (PRILOSEC) 20 MG capsule, Take 1 capsule by mouth daily., Disp: , Rfl:    tadalafil  (CIALIS ) 5 MG tablet, Take 1 tablet (5 mg total) by mouth daily., Disp: 30 tablet, Rfl: 11   testosterone  cypionate (DEPOTESTOSTERONE CYPIONATE) 200 MG/ML injection, Inject 200 mg into the muscle. 200 mg every 10 days, Disp: , Rfl:    traMADol  (ULTRAM ) 50 MG tablet, Take 1  tablet (50 mg total) by mouth every 8 (eight) hours as needed. (Patient not taking: Reported on 03/06/2024), Disp: 20 tablet, Rfl: 0   traMADol  (ULTRAM ) 50 MG tablet, Take 1 tablet (50 mg total) by mouth every 8 (eight) hours as needed. (Patient not taking: Reported on 03/06/2024), Disp: 20 tablet, Rfl: 0  Social History: Social History   Tobacco Use   Smoking status: Former    Current packs/day: 0.50    Types: Cigarettes    Passive exposure: Never   Smokeless tobacco: Never   Tobacco comments:    Vaping 3 days a week   Vaping Use   Vaping status: Some Days  Substance Use Topics   Alcohol use: Not  Currently   Drug use: Yes    Types: Marijuana    Family Medical History: Family History  Problem Relation Age of Onset   Hypertension Mother    Asthma Father    Bipolar disorder Sister     Physical Examination: There were no vitals filed for this visit.  General: Patient is in no apparent distress. Attention to examination is appropriate.  Neck:   Supple.  Full range of motion.  Respiratory: Patient is breathing without any difficulty.   NEUROLOGICAL:     Awake, alert, oriented to person, place, and time.  Speech is clear and fluent.   Cranial Nerves: Pupils equal round and reactive to light.  Facial tone is symmetric. Shoulder shrug is symmetric. Tongue protrusion is midline.  There is no pronator drift.  Motor Exam:  He has good strength in his median innervated musculature.  He has a positive Tinel sign, positive carpal compression, positive Phalen  Slight decrease in his median distribution on the right   Medical Decision Making  Imaging: No relevant imaging  Electrodiagnostics:  [facility]  [address] [place], [address]  [place], [place] [address] Tel: [phone] Fax: [phone] Test Date:  04/19/2024   Patient: Hunter Huynh DOB: [birth year] Physician: Tonita Blanch, DO  Sex: Male Height: 6' 3 Ref Phys: Tonita Blanch, DO  ID#: [id number]     Technician:      History: This is a 43 year old man referred for evaluation of right hand paresthesias and pain.   NCV & EMG Findings: Extensive electrodiagnostic testing of the right upper extremity shows:  Right median sensory response shows prolonged latency (4.5 ms) and reduced amplitude (6.8 V).  Right ulnar sensory response is within normal limits. Right median motor response shows prolonged latency (4.1 ms).  Right ulnar motor response is within normal limits.   Normal EMG.  The right median sensory nerve showed prolonged distal peak All remaining nerves (as indicated in the following tables)  were within normal limits.     Impression: Right median neuropathy at or distal to the wrist, consistent with a clinical diagnosis of carpal tunnel syndrome.  Overall, these findings are moderate in degree electrically.     ___________________________ Tonita Blanch, DO  I have personally reviewed the images and electrodiagnostics and agree with the above interpretation.  Assessment and Plan: [name] is a pleasant 43 y.o. male with symptoms of carpal tunnel syndrome for the last 8 months.  He has been treated conservatively.  Has tried bracing.  Lifestyle modification.  Continues to have symptoms consistent with median neuropathy at the wrist.  This wakes him from nighttime.  He also gets daytime symptoms when he is using his videogames or using his hands.  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  Continues to have bad symptoms went to see neurology for nerve testing found to have moderate degree carpal tunnel syndrome on the right.  Given the symptoms and his ongoing symptoms for over 8 months, impact on his quality of life, impact on his activities of daily living, moderate degree on his testing we have offered a carpal tunnel decompression.  We discussed carpal tunnel decompression with ultrasound guidance.  He would like to go forward with the procedure.  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  He will plan on reaching out to us  once he knows his class schedule and we will plan for a carpal tunnel decompression on the right with ultrasound guidance.  Penne MICAEL Sharps MD/MSCR Neurosurgery - Peripheral Nerve Surgery

## 2024-04-25 ENCOUNTER — Encounter: Payer: Self-pay | Admitting: Neurosurgery

## 2024-04-25 ENCOUNTER — Ambulatory Visit (INDEPENDENT_AMBULATORY_CARE_PROVIDER_SITE_OTHER): Admitting: Neurosurgery

## 2024-04-25 VITALS — Wt 357.6 lb

## 2024-04-25 DIAGNOSIS — G5601 Carpal tunnel syndrome, right upper limb: Secondary | ICD-10-CM | POA: Diagnosis not present

## 2024-04-26 ENCOUNTER — Telehealth: Payer: Self-pay

## 2024-04-26 ENCOUNTER — Other Ambulatory Visit: Payer: Self-pay

## 2024-04-26 DIAGNOSIS — G5601 Carpal tunnel syndrome, right upper limb: Secondary | ICD-10-CM

## 2024-04-26 DIAGNOSIS — Z01818 Encounter for other preprocedural examination: Secondary | ICD-10-CM

## 2024-04-26 NOTE — Telephone Encounter (Signed)
-----   Message from [name] sent at 04/25/2024  4:26 PM EDT ----- Regarding: Case posting ?? Neurosurgery Booking Request - Dr. Penne Sharps  Patient Name: Hunter Huynh DOB: [birth year] MRN: [id number] Sex: male Phone Number(s): [phone] (home)  Interpreter Needed: No Language:   Surgeon: Penne Sharps, MD Assist: Per Protocol Preop Diagnosis: Right sided carpal tunnel syndrome Requested Surgery Date/Time: He will call back to schedule Surgery Location: [facility] Patient Class: Ambulatory/Outpatient Anesthesia Type: Monitored anesthesia care Requested Length of Surgery: Per protocol Laterality: right Positioning: Supine stretcher armboard Surgical Procedure: Right sided carpal tunnel decompression with ultrasound guidance Special Case Needs (equipment, implants, sets): Ultrasound, sonics Brainlab: N/A Pathology Needed: N/A Rep Needed: No Rep/Company Name: [employer] Indication for Consent: Right sided carpal tunnel syndrome CPT Codes: 35278 Monitoring Required: No Brace Required: No Type of Brace: No Order Faxed to [facility]: No  K[facility] Check List Pre-admit Office Visit Date/Time:  Patient Notified:  Case Booked By:  Date Booked:  Orders Placed:  Epic Referral Created:  Auth Documented in Auth/Cert:  Notes on Schedule:  Documented/Highlighted on Calendar:  Booking Number:

## 2024-04-26 NOTE — Telephone Encounter (Signed)
 Planned surgery: Right carpal tunnel decompression with ultrasound guidance   Surgery date: 05/15/24 at [facility]: [address], [place], [place] [address]) - you will find out your arrival time the business day before your surgery.   Pre-op appointment at [facility] Pre-admit Testing: you will receive a call with a date/time for this appointment. If you are scheduled for an in person appointment, Pre-admit Testing is located on the first floor of the [facility] building, [address]. During this appointment, they will advise you which medications you can take the morning of surgery, and which medications you will need to hold for surgery. Labs (such as blood work, EKG) may be done at your pre-op appointment. You are not required to fast for these labs. Should you need to change your pre-op appointment, please call Pre-admit testing at [phone].     How to contact us :  If you have any questions/concerns before or after surgery, you can reach us  at [phone], or you can send a mychart message. We can be reached by phone or mychart 8am-4pm, Monday-Friday.  *Please note: Calls after 4pm are forwarded to a third party answering service. Mychart messages are not routinely monitored during evenings, weekends, and holidays. Please call our office to contact the answering service for urgent concerns during non-business hours.   If you have FMLA/disability paperwork, please drop it off or fax it to [phone]   Appointments/FMLA & disability paperwork: [employer] Registered Nurse/Surgery scheduler: Jassmin Kemmerer, RN Certified Medical Assistants: Don, CMA, Elenor, CMA, & Damien, CMA Physician Assistants: Lyle Decamp, PA-C, Edsel Goods, PA-C & Glade Boys, PA-C Surgeons: Penne Sharps, MD & Reeves Daisy, MD   [facility] PREADMIT TESTING VISIT and SURGERY INFORMATION SHEET   Now that  surgery has been scheduled you can anticipate several phone calls from [facility] services. A pharmacy technician will call you to verify your current list of medications taken at home.               The Pre-Service Center will call to verify your insurance information and to give you billing estimates and information.             The Preadmit Testing Office will be calling to schedule a visit to obtain information for the anesthesia team and provide instructions on preparation for surgery.  What can you expect for the Preadmit Testing Visit: Appointments may be scheduled in-person or by telephone.  If a telephone visit is scheduled, you may be asked to come into the office to have lab tests or other studies performed.   This visit will not be completed any greater than 14 days prior to your surgery.  If your surgery has been scheduled for a future date, please do not be alarmed if we have not contacted you to schedule an appointment more than a month prior to the surgery date.    Please be prepared to provide the following information during this appointment:            -Personal medical history                                               -Medication and allergy list            -Any history of problems with anesthesia              -  Recent lab work or diagnostic studies            -Please notify us  of any needs we should be aware of to provide the best care possible           -You will be provided with instructions on how to prepare for your surgery.    On The Day of Surgery:  You must have a driver to take you home after surgery, you will be asked not to drive for 24 hours following surgery.  Taxi, Gisele and non-medical transport will not be acceptable means of transportation unless you have a responsible individual who will be traveling with you.  Visitors in the surgical area:   2 people will be able to visit you in your room once your preparation for surgery has been completed. During  surgery, your visitors will be asked to wait in the Surgery Waiting Area.  It is not a requirement for them to stay, if they prefer to leave and come back.  Your visitor(s) will be given an update once the surgery has been completed.  No visitors are allowed in the initial recovery room to respect patient privacy and safety.  Once you are more awake and transfer to the secondary recovery area, or are transferred to an inpatient room, visitors will again be able to see you.  To respect and protect your privacy: We will ask on the day of surgery who your driver will be and what the contact number for that individual will be. We will ask if it is okay to share information with this individual, or if there is an alternative individual that we, or the surgeon, should contact to provide updates and information. If family or friends come to the surgical information desk requesting information about you, who you have not listed with us , no information will be given.   It may be helpful to designate someone as the main contact who will be responsible for updating your other friends and family.    PREADMIT TESTING OFFICE: [phone] SAME DAY SURGERY: [phone] We look forward to caring for you before and throughout the process of your surgery.

## 2024-04-27 NOTE — Addendum Note (Signed)
 Addended by: Lunell Robart W on: 04/27/2024 02:12 PM   Modules accepted: Orders

## 2024-05-08 ENCOUNTER — Encounter
Admission: RE | Admit: 2024-05-08 | Discharge: 2024-05-08 | Disposition: A | Discharge status: Home / Self Care | Source: Ambulatory Visit | Attending: Neurosurgery | Admitting: Neurosurgery

## 2024-05-08 ENCOUNTER — Other Ambulatory Visit: Payer: Self-pay

## 2024-05-08 DIAGNOSIS — E232 Diabetes insipidus: Secondary | ICD-10-CM

## 2024-05-08 DIAGNOSIS — Z01812 Encounter for preprocedural laboratory examination: Secondary | ICD-10-CM

## 2024-05-08 DIAGNOSIS — I10 Essential (primary) hypertension: Secondary | ICD-10-CM

## 2024-05-08 DIAGNOSIS — G5601 Carpal tunnel syndrome, right upper limb: Secondary | ICD-10-CM

## 2024-05-08 HISTORY — DX: Other specified postprocedural states: Z98.890

## 2024-05-08 HISTORY — DX: Nausea with vomiting, unspecified: R11.2

## 2024-05-08 NOTE — Patient Instructions (Addendum)
 Your procedure is scheduled on: 05/15/24 - Tuesday Report to the Registration Desk on the 1st floor of the [facility] - [address] [place], [place]  To find out your arrival time, please call [phone] between 1PM - 3PM on: 05/14/24 - Monday If your arrival time is 6:00 am, do not arrive before that time as the [facility] entrance doors do not open until 6:00 am.  [facility] to [facility]/EKG on 08/20 at 2 pm- [address] [place], [address] - pre Admission testing.  REMEMBER: Instructions that are not followed completely may result in serious medical risk, up to and including death; or upon the discretion of your surgeon and anesthesiologist your surgery may need to be rescheduled.  Do not eat food after midnight the night before surgery.  No gum chewing or hard candies.  You may however, drink CLEAR liquids up to 2 hours before you are scheduled to arrive for your surgery. Do not drink anything within 2 hours of your scheduled arrival time.  Clear liquids include: - water  - apple juice without pulp - gatorade (not RED colors) - black coffee or tea (Do NOT add milk or creamers to the coffee or tea) Do NOT drink anything that is not on this list.   You may take if needed,  Anti-inflammatories (NSAIDS) such as Advil , Aleve , Ibuprofen , Motrin , Naproxen , Naprosyn  and Aspirin based products such as Excedrin, Goody's Powder, BC Powder.  Stop ANY OVER THE COUNTER supplements until after surgery.  You may continue to take Tylenol  if needed for pain up until the day of surgery.  tadalafil  (CIALIS  ) - Hold 2 days prior to your surgery.  ON THE DAY OF SURGERY ONLY TAKE THESE MEDICATIONS WITH SIPS OF WATER:  desmopressin (DDAVP NASAL)  hydrocortisone (CORTEF)   levothyroxine (SYNTHROID)  omeprazole (PRILOSEC)    No Alcohol for 24 hours before or after surgery.  No Smoking including e-cigarettes for 24 hours before surgery.  No chewable  tobacco products for at least 6 hours before surgery.  No nicotine patches on the day of surgery.  Do not use any recreational drugs for at least a week (preferably 2 weeks) before your surgery.  Please be advised that the combination of cocaine and anesthesia may have negative outcomes, up to and including death. If you test positive for cocaine, your surgery will be cancelled.  On the morning of surgery brush your teeth with toothpaste and water, you may rinse your mouth with mouthwash if you wish. Do not swallow any toothpaste or mouthwash.  Use CHG Soap or wipes as directed on instruction sheet.  Do not wear jewelry, make-up, hairpins, clips or nail polish.  For welded (permanent) jewelry: bracelets, anklets, waist bands, etc.  Please have this removed prior to surgery.  If it is not removed, there is a chance that hospital personnel will need to cut it off on the day of surgery.  Do not wear lotions, powders, or perfumes.   Do not shave body hair from the neck down 48 hours before surgery.  Contact lenses, hearing aids and dentures may not be worn into surgery.  Do not bring valuables to the hospital. [facility] is not responsible for any missing/lost belongings or valuables.   Notify your doctor if there is any change in your medical condition (cold, fever, infection).  Wear comfortable clothing (specific to your surgery type) to the hospital.  After surgery, you can help prevent lung  complications by doing breathing exercises.  Take deep breaths and cough every 1-2 hours. Your doctor may order a device called an Incentive Spirometer to help you take deep breaths.  When coughing or sneezing, hold a pillow firmly against your incision with both hands. This is called "splinting." Doing this helps protect your incision. It also decreases belly discomfort.  If you are being admitted to the hospital overnight, leave your suitcase in the car. After surgery it may be brought to  your room.  In case of increased patient census, it may be necessary for you, the patient, to continue your postoperative care in the Same Day Surgery department.  If you are being discharged the day of surgery, you will not be allowed to drive home. You will need a responsible individual to drive you home and stay with you for 24 hours after surgery.   If you are taking public transportation, you will need to have a responsible individual with you.  Please call the Pre-admissions Testing Dept. at [phone] if you have any questions about these instructions.  Surgery Visitation Policy:  Patients having surgery or a procedure may have two visitors.  Children under the age of 47 must have an adult with them who is not the patient.  Inpatient Visitation:    Visiting hours are 7 a.m. to 8 p.m. Up to four visitors are allowed at one time in a patient room. The visitors may rotate out with other people during the day.  One visitor age 40 or older may stay with the patient overnight and must be in the room by 8 p.m.   Merchandiser, retail to address health-related social needs:  [email or web address]     Preparing for Surgery with CHLORHEXIDINE GLUCONATE (CHG) Soap  Chlorhexidine Gluconate (CHG) Soap  o An antiseptic cleaner that kills germs and bonds with the skin to continue killing germs even after washing  o Used for showering the night before surgery and morning of surgery  Before surgery, you can play an important role by reducing the number of germs on your skin.  CHG (Chlorhexidine gluconate) soap is an antiseptic cleanser which kills germs and bonds with the skin to continue killing germs even after washing.  Please do not use if you have an allergy to CHG or antibacterial soaps. If your skin becomes reddened/irritated stop using the CHG.  1. Shower the NIGHT BEFORE SURGERY and the MORNING OF SURGERY with CHG soap.  2. If you choose to wash your  hair, wash your hair first as usual with your normal shampoo.  3. After shampooing, rinse your hair and body thoroughly to remove the shampoo.  4. Use CHG as you would any other liquid soap. You can apply CHG directly to the skin and wash gently with a scrungie or a clean washcloth.  5. Apply the CHG soap to your body only from the neck down. Do not use on open wounds or open sores. Avoid contact with your eyes, ears, mouth, and genitals (private parts). Wash face and genitals (private parts) with your normal soap.  6. Wash thoroughly, paying special attention to the area where your surgery will be performed.  7. Thoroughly rinse your body with warm water.  8. Do not shower/wash with your normal soap after using and rinsing off the CHG soap.  9. Pat yourself dry with a clean towel.  10. Wear clean pajamas to bed the night before surgery.  12. Place clean sheets on your bed the night  of your first shower and do not sleep with pets.  13. Shower again with the CHG soap on the day of surgery prior to arriving at the hospital.  14. Do not apply any deodorants/lotions/powders.  15. Please wear clean clothes to the hospital.

## 2024-05-09 ENCOUNTER — Encounter
Admission: RE | Admit: 2024-05-09 | Discharge: 2024-05-09 | Disposition: A | Discharge status: Home / Self Care | Source: Ambulatory Visit | Attending: Neurosurgery | Admitting: Neurosurgery

## 2024-05-09 DIAGNOSIS — I10 Essential (primary) hypertension: Secondary | ICD-10-CM

## 2024-05-09 DIAGNOSIS — E232 Diabetes insipidus: Secondary | ICD-10-CM | POA: Insufficient documentation

## 2024-05-09 DIAGNOSIS — Z01818 Encounter for other preprocedural examination: Secondary | ICD-10-CM | POA: Diagnosis present

## 2024-05-09 DIAGNOSIS — Z01812 Encounter for preprocedural laboratory examination: Secondary | ICD-10-CM | POA: Diagnosis not present

## 2024-05-09 LAB — BASIC METABOLIC PANEL WITH GFR
Anion gap: 8 (ref 5–15)
BUN: 10 mg/dL (ref 6–20)
CO2: 26 mmol/L (ref 22–32)
Calcium: 8.6 mg/dL — ABNORMAL LOW (ref 8.9–10.3)
Chloride: 104 mmol/L (ref 98–111)
Creatinine, Ser: 1.09 mg/dL (ref 0.61–1.24)
GFR, Estimated: >60 mL/min (ref >60)
Glucose, Bld: 141 mg/dL — ABNORMAL HIGH (ref 70–99)
Potassium: 4 mmol/L (ref 3.5–5.1)
Sodium: 138 mmol/L (ref 135–145)

## 2024-05-09 LAB — CBC
HCT: 41.8 % (ref 39.0–52.0)
Hemoglobin: 13.8 g/dL (ref 13.0–17.0)
MCH: 27.4 pg (ref 26.0–34.0)
MCHC: 33 g/dL (ref 30.0–36.0)
MCV: 83.1 fL (ref 80.0–100.0)
Platelets: 342 K/uL (ref 150–400)
RBC: 5.03 MIL/uL (ref 4.22–5.81)
RDW: 14.1 % (ref 11.5–15.5)
WBC: 6.9 K/uL (ref 4.0–10.5)
nRBC: 0 % (ref 0.0–0.2)

## 2024-05-11 ENCOUNTER — Encounter: Payer: Self-pay | Admitting: Neurosurgery

## 2024-05-11 NOTE — Progress Notes (Signed)
  Perioperative Services Pre-Admission/Anesthesia Testing    Date: 05/11/24  Name: Hunter Huynh DOB: [birth year] MRN:   [id number]  Re: Plans for surgery; stress dose steroids in setting of adrenal insufficiency  Planned Surgical Procedure(s):     Case: [id number] Date/Time: 05/15/24 0756   Procedure: CARPAL TUNNEL RELEASE (Right) - RIGHT CARPAL TUNNEL DECOMPRESSION WITH ULTRASOUND GUIDANCE   Anesthesia type: Monitor Anesthesia Care   Diagnosis: Right carpal tunnel syndrome [G56.01]   Pre-op diagnosis: G56.01 Right carpal tunnel syndrome   Location: [facility] OR ROOM 03 / [facility] ORS FOR ANESTHESIA GROUP   Surgeons: Claudene Penne ORN, MD        Clinical Notes:  Patient is scheduled for the above procedure on 05/15/2024 with Dr. Penne ORN Claudene, MD.  In review of his chart prior to upcoming procedure, patient noted to have an adrenal insufficiency diagnosis.  Patient is followed by endocrinology.  Reached out to specialist to discuss need for perioperative stress dose corticosteroid therapy.  Received communication back from endocrinologist indicating the following:  Patient will need to continue his DDAVP  Patient should receive Solu-Medrol  100 mg IV just prior to the procedure.  Patient has been provided with instructions on increasing his hydrocortisone by endocrinology.   Reached out to anesthesia to discuss whether the Solu-Medrol  should be given in preop holding area or this is something that anesthesia will administer in the OR. Per anesthesia, medication will be given by anesthesia intraoperatively (per Piscitello, MD). In speaking with the anesthesia team, Tod, CRNA asking that order be placed by PAT APP to avoid the steroid dose being inadvertently omitted. Order entered per request and placed in sign and hold status for release on day of surgery.      Dorise Pereyra, MSN, APRN, FNP-C, CEN [facility]  Perioperative Services Nurse Practitioner Phone: [phone] Fax: [phone] 05/11/24 3:33 PM  NOTE: This note has been prepared using Dragon dictation software. Despite my best ability to proofread, there is always the potential that unintentional transcriptional errors may still occur from this process.

## 2024-05-13 ENCOUNTER — Other Ambulatory Visit: Payer: Self-pay | Admitting: Physical Medicine and Rehabilitation

## 2024-05-15 ENCOUNTER — Other Ambulatory Visit: Payer: Self-pay

## 2024-05-15 ENCOUNTER — Ambulatory Visit: Payer: Self-pay | Admitting: Urgent Care

## 2024-05-15 ENCOUNTER — Ambulatory Visit
Admission: RE | Admit: 2024-05-15 | Discharge: 2024-05-15 | Disposition: A | Discharge status: Home / Self Care | Attending: Neurosurgery | Admitting: Neurosurgery

## 2024-05-15 ENCOUNTER — Encounter
Admission: RE | Disposition: A | Discharge status: Home / Self Care | Payer: Self-pay | Source: Home / Self Care | Attending: Neurosurgery

## 2024-05-15 ENCOUNTER — Encounter: Payer: Self-pay | Admitting: Neurosurgery

## 2024-05-15 DIAGNOSIS — E232 Diabetes insipidus: Secondary | ICD-10-CM

## 2024-05-15 DIAGNOSIS — I10 Essential (primary) hypertension: Secondary | ICD-10-CM | POA: Diagnosis not present

## 2024-05-15 DIAGNOSIS — K219 Gastro-esophageal reflux disease without esophagitis: Secondary | ICD-10-CM | POA: Insufficient documentation

## 2024-05-15 DIAGNOSIS — E039 Hypothyroidism, unspecified: Secondary | ICD-10-CM | POA: Insufficient documentation

## 2024-05-15 DIAGNOSIS — E2740 Unspecified adrenocortical insufficiency: Secondary | ICD-10-CM | POA: Insufficient documentation

## 2024-05-15 DIAGNOSIS — F17290 Nicotine dependence, other tobacco product, uncomplicated: Secondary | ICD-10-CM | POA: Insufficient documentation

## 2024-05-15 DIAGNOSIS — G5601 Carpal tunnel syndrome, right upper limb: Secondary | ICD-10-CM

## 2024-05-15 DIAGNOSIS — E119 Type 2 diabetes mellitus without complications: Secondary | ICD-10-CM | POA: Diagnosis not present

## 2024-05-15 DIAGNOSIS — Z01812 Encounter for preprocedural laboratory examination: Secondary | ICD-10-CM

## 2024-05-15 DIAGNOSIS — Z01818 Encounter for other preprocedural examination: Secondary | ICD-10-CM

## 2024-05-15 HISTORY — DX: Male erectile dysfunction, unspecified: N52.9

## 2024-05-15 HISTORY — DX: Endocrine disorder, unspecified: E34.9

## 2024-05-15 HISTORY — DX: Gastro-esophageal reflux disease without esophagitis: K21.9

## 2024-05-15 HISTORY — DX: Hypothyroidism, unspecified: E03.9

## 2024-05-15 HISTORY — DX: Other lesions of median nerve, right upper limb: G56.11

## 2024-05-15 HISTORY — DX: Unspecified adrenocortical insufficiency: E27.40

## 2024-05-15 HISTORY — DX: Type 2 diabetes mellitus without complications: E11.9

## 2024-05-15 LAB — GLUCOSE, CAPILLARY
Glucose-Capillary: 92 mg/dL (ref 70–99)
Glucose-Capillary: 111 mg/dL — ABNORMAL HIGH (ref 70–99)

## 2024-05-15 SURGERY — CARPAL TUNNEL RELEASE
Anesthesia: General | Site: Wrist | Laterality: Right

## 2024-05-15 MED ORDER — 0.9 % SODIUM CHLORIDE (POUR BTL) OPTIME
TOPICAL | Status: DC | PRN
  Administered 2024-05-15: 500 mL

## 2024-05-15 MED ORDER — CHLORHEXIDINE GLUCONATE 0.12 % MT SOLN
OROMUCOSAL | Status: AC
  Filled 2024-05-15: qty 15

## 2024-05-15 MED ORDER — KETAMINE HCL 50 MG/5ML IJ SOSY
PREFILLED_SYRINGE | INTRAMUSCULAR | Status: AC
  Filled 2024-05-15: qty 5

## 2024-05-15 MED ORDER — FENTANYL CITRATE (PF) 100 MCG/2ML IJ SOLN: 25.0000 ug | INTRAMUSCULAR | Status: DC | PRN

## 2024-05-15 MED ORDER — OXYCODONE HCL 5 MG/5ML PO SOLN: 5.0000 mg | Freq: Once | ORAL | Status: DC | PRN

## 2024-05-15 MED ORDER — PHENYLEPHRINE HCL-NACL 20-0.9 MG/250ML-% IV SOLN
INTRAVENOUS | Status: AC
  Filled 2024-05-15: qty 250

## 2024-05-15 MED ORDER — METHYLPREDNISOLONE SODIUM SUCC 125 MG IJ SOLR
INTRAMUSCULAR | Status: AC
  Filled 2024-05-15: qty 2

## 2024-05-15 MED ORDER — LIDOCAINE HCL (PF) 2 % IJ SOLN
INTRAMUSCULAR | Status: AC
Start: 2024-05-15 — End: 2024-05-15
  Filled 2024-05-15: qty 5

## 2024-05-15 MED ORDER — OXYCODONE HCL 5 MG PO TABS: 5.0000 mg | ORAL_TABLET | Freq: Once | ORAL | Status: DC | PRN

## 2024-05-15 MED ORDER — FENTANYL CITRATE (PF) 100 MCG/2ML IJ SOLN
INTRAMUSCULAR | Status: AC
  Filled 2024-05-15: qty 2

## 2024-05-15 MED ORDER — LIDOCAINE HCL (CARDIAC) PF 100 MG/5ML IV SOSY
PREFILLED_SYRINGE | INTRAVENOUS | Status: DC | PRN
  Administered 2024-05-15: 100 mg via INTRAVENOUS

## 2024-05-15 MED ORDER — SURGIFLO WITH THROMBIN (HEMOSTATIC MATRIX KIT) OPTIME
TOPICAL | Status: DC | PRN
  Administered 2024-05-15: 1 via TOPICAL

## 2024-05-15 MED ORDER — MIDAZOLAM HCL 2 MG/2ML IJ SOLN
INTRAMUSCULAR | Status: DC | PRN
  Administered 2024-05-15: 2 mg via INTRAVENOUS

## 2024-05-15 MED ORDER — PROPOFOL 1000 MG/100ML IV EMUL
INTRAVENOUS | Status: AC
  Filled 2024-05-15: qty 500

## 2024-05-15 MED ORDER — LIDOCAINE HCL (PF) 1 % IJ SOLN
INTRAMUSCULAR | Status: AC
  Filled 2024-05-15: qty 60

## 2024-05-15 MED ORDER — CEFAZOLIN IN SODIUM CHLORIDE 3-0.9 GM/100ML-% IV SOLN
3.0000 g | Freq: Once | INTRAVENOUS | Status: DC
  Filled 2024-05-15: qty 200
  Filled 2024-05-15: qty 100
  Filled 2024-05-15: qty 200

## 2024-05-15 MED ORDER — FENTANYL CITRATE (PF) 100 MCG/2ML IJ SOLN
INTRAMUSCULAR | Status: DC | PRN
  Administered 2024-05-15: 50 ug via INTRAVENOUS

## 2024-05-15 MED ORDER — METHYLPREDNISOLONE SODIUM SUCC 125 MG IJ SOLR
100.0000 mg | Freq: Once | INTRAMUSCULAR | Status: AC
  Administered 2024-05-15: 100 mg via INTRAVENOUS
  Filled 2024-05-15: qty 1.6

## 2024-05-15 MED ORDER — TRAMADOL HCL 50 MG PO TABS
50.0000 mg | ORAL_TABLET | Freq: Four times a day (QID) | ORAL | 0 refills | Status: AC | PRN
  Filled 2024-05-15: qty 20, 5d supply, fill #0

## 2024-05-15 MED ORDER — PROPOFOL 500 MG/50ML IV EMUL
INTRAVENOUS | Status: DC | PRN
  Administered 2024-05-15: 100 ug/kg/min via INTRAVENOUS

## 2024-05-15 MED ORDER — ORAL CARE MOUTH RINSE: 15.0000 mL | Freq: Once | OROMUCOSAL | Status: AC

## 2024-05-15 MED ORDER — CHLORHEXIDINE GLUCONATE 0.12 % MT SOLN
15.0000 mL | Freq: Once | OROMUCOSAL | Status: AC
  Administered 2024-05-15: 15 mL via OROMUCOSAL

## 2024-05-15 MED ORDER — SODIUM CHLORIDE 0.9 % IV SOLN: INTRAVENOUS | Status: DC

## 2024-05-15 MED ORDER — ONDANSETRON HCL 4 MG/2ML IJ SOLN
INTRAMUSCULAR | Status: DC | PRN
  Administered 2024-05-15: 4 mg via INTRAVENOUS

## 2024-05-15 MED ORDER — MIDAZOLAM HCL 2 MG/2ML IJ SOLN
INTRAMUSCULAR | Status: AC
Start: 2024-05-15 — End: 2024-05-15
  Filled 2024-05-15: qty 2

## 2024-05-15 MED ORDER — CEFAZOLIN SODIUM-DEXTROSE 3-4 GM/150ML-% IV SOLN
3.0000 g | INTRAVENOUS | Status: AC
  Administered 2024-05-15: 3 g via INTRAVENOUS
  Filled 2024-05-15: qty 150

## 2024-05-15 MED ORDER — FENTANYL CITRATE (PF) 100 MCG/2ML IJ SOLN
INTRAMUSCULAR | Status: AC
Start: 2024-05-15 — End: 2024-05-15
  Filled 2024-05-15: qty 2

## 2024-05-15 MED ORDER — LIDOCAINE HCL 1 % IJ SOLN
INTRAMUSCULAR | Status: DC | PRN
  Administered 2024-05-15: 5 mL

## 2024-05-15 SURGICAL SUPPLY — 22 items
BNDG ADH 1X3 SHEER STRL LF (GAUZE/BANDAGES/DRESSINGS) ×1 IMPLANT
BNDG ADH 2 X3.75 FABRIC TAN LF (GAUZE/BANDAGES/DRESSINGS) IMPLANT
BNDG ELASTIC 3X5.8 VLCR NS LF (GAUZE/BANDAGES/DRESSINGS) IMPLANT
BRUSH SCRUB EZ 4% CHG (MISCELLANEOUS) ×1 IMPLANT
CHLORAPREP W/TINT 26 (MISCELLANEOUS) ×1 IMPLANT
COVER PROBE FLX POLY STRL (MISCELLANEOUS) ×1 IMPLANT
DERMABOND ADVANCED .7 DNX12 (GAUZE/BANDAGES/DRESSINGS) ×1 IMPLANT
DRAPE EXTREMITY 106X87X128.5 (DRAPES) ×1 IMPLANT
DRAPE IMP U-DRAPE 54X76 (DRAPES) ×1 IMPLANT
DRAPE SHEET LG 3/4 BI-LAMINATE (DRAPES) ×1 IMPLANT
GLOVE BIOGEL PI IND STRL 8 (GLOVE) ×1 IMPLANT
GLOVE SRG 8 PF TXTR STRL LF DI (GLOVE) ×1 IMPLANT
GLOVE SURG SYN 7.5 PF PI (GLOVE) ×1 IMPLANT
GOWN SRG XL LVL 3 NONREINFORCE (GOWNS) ×1 IMPLANT
KIT TURNOVER KIT A (KITS) ×1 IMPLANT
NDL HYPO 25X1 1.5 SAFETY (NEEDLE) ×1 IMPLANT
NEEDLE HYPO 25X1 1.5 SAFETY (NEEDLE) ×1 IMPLANT
NS IRRIG 500ML POUR BTL (IV SOLUTION) ×1 IMPLANT
PACK BASIN MINOR ARMC (MISCELLANEOUS) ×1 IMPLANT
STOCKINETTE IMPERVIOUS 9X36 MD (GAUZE/BANDAGES/DRESSINGS) ×1 IMPLANT
SURGIFLO W/THROMBIN 8M KIT (HEMOSTASIS) IMPLANT
ULTRAGLIDE CTR (BLADE) ×1 IMPLANT

## 2024-05-15 NOTE — Interval H&P Note (Signed)
 History and Physical Interval Note:  05/15/2024 7:40 AM  Hunter Huynh  has presented today for surgery, with the diagnosis of G56.01 Right carpal tunnel syndrome.  The various methods of treatment have been discussed with the patient and family. After consideration of risks, benefits and other options for treatment, the patient has consented to  Procedure(s) with comments: CARPAL TUNNEL RELEASE (Right) - RIGHT CARPAL TUNNEL DECOMPRESSION WITH ULTRASOUND GUIDANCE as a surgical intervention.  The patient's history has been reviewed, patient examined, no change in status, stable for surgery.  I have reviewed the patient's chart and labs.  Questions were answered to the patient's satisfaction.    HEART AND LUNGS CLEAR   [name]

## 2024-05-15 NOTE — Anesthesia Postprocedure Evaluation (Signed)
 Anesthesia Post Note  Patient: Hunter Huynh  Procedure(s) Performed: CARPAL TUNNEL RELEASE (Right: Wrist)  Patient location during evaluation: PACU Anesthesia Type: General Level of consciousness: awake and alert Pain management: pain level controlled Vital Signs Assessment: post-procedure vital signs reviewed and stable Respiratory status: spontaneous breathing, nonlabored ventilation and respiratory function stable Cardiovascular status: blood pressure returned to baseline and stable Postop Assessment: no apparent nausea or vomiting Anesthetic complications: no   No notable events documented.   Last Vitals:  Vitals:   05/15/24 0845 05/15/24 0900  BP: (!) 169/93 (!) 143/87  Pulse: 67 (!) 58  Resp: 20   Temp:  (!) 36.1 C  SpO2: 99% 98%    Last Pain:  Vitals:   05/15/24 0900  TempSrc: Temporal  PainSc: 0-No pain                 Fairy POUR Slyvester Latona

## 2024-05-15 NOTE — Anesthesia Preprocedure Evaluation (Signed)
 Anesthesia Evaluation  Patient identified by MRN, date of birth, ID band Patient awake    Reviewed: Allergy & Precautions, NPO status , Patient's Chart, lab work & pertinent test results  History of Anesthesia Complications (+) PONV and history of anesthetic complications  Airway Mallampati: III  TM Distance: >3 FB Neck ROM: full    Dental  (+) Chipped, Poor Dentition   Pulmonary sleep apnea , Patient abstained from smoking., former smoker   Pulmonary exam normal        Cardiovascular Exercise Tolerance: Good hypertension, (-) angina (-) Past MI and (-) PND Normal cardiovascular exam     Neuro/Psych  Neuromuscular disease  negative psych ROS   GI/Hepatic Neg liver ROS,GERD  Controlled,,  Endo/Other  diabetesHypothyroidism    Renal/GU negative Renal ROS  negative genitourinary   Musculoskeletal   Abdominal   Peds  Hematology negative hematology ROS (+)   Anesthesia Other Findings Past Medical History: No date: Adrenal insufficiency (HCC)     Comment:  a.) on hydrocortisone + DDAVP No date: Erectile dysfunction     Comment:  a.) on PDE5i (tadalafil ) No date: GERD (gastroesophageal reflux disease) 02/2022: History of brain surgery No date: Hypertension No date: Hypotestosteronism     Comment:  a.) on TRT injections (depotestosterone cypionate) No date: Hypothyroidism No date: PONV (postoperative nausea and vomiting) 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 No date: T2DM (type 2 diabetes mellitus) (HCC)  Past Surgical History: 01/2022: BRAIN SURGERY 02/2022: brain tumor removed  BMI    Body Mass Index: 44.37 kg/m      Reproductive/Obstetrics negative OB ROS                              Anesthesia Physical Anesthesia Plan  ASA: 3  Anesthesia Plan: General   Post-op Pain Management:    Induction: Intravenous  PONV Risk Score and Plan: Propofol  infusion and TIVA  Airway  Management Planned: Natural Airway and Nasal Cannula  Additional Equipment:   Intra-op Plan:   Post-operative Plan:   Informed Consent: I have reviewed the patients History and Physical, chart, labs and discussed the procedure including the risks, benefits and alternatives for the proposed anesthesia with the patient or authorized representative who has indicated his/her understanding and acceptance.     Dental Advisory Given  Plan Discussed with: Anesthesiologist, CRNA and Surgeon  Anesthesia Plan Comments: (Patient consented for risks of anesthesia including but not limited to:  - adverse reactions to medications - risk of airway placement if required - damage to eyes, teeth, lips or other oral mucosa - nerve damage due to positioning  - sore throat or hoarseness - Damage to heart, brain, nerves, lungs, other parts of body or loss of life  Patient voiced understanding and assent.)        Anesthesia Quick Evaluation

## 2024-05-15 NOTE — Transfer of Care (Signed)
 Immediate Anesthesia Transfer of Care Note  Patient: Hunter Huynh  Procedure(s) Performed: CARPAL TUNNEL RELEASE (Right: Wrist)  Patient Location: PACU  Anesthesia Type:General  Level of Consciousness: awake  Airway & Oxygen Therapy: Patient Spontanous Breathing and Patient connected to face mask oxygen  Post-op Assessment: Report given to RN and Post -op Vital signs reviewed and stable  Post vital signs: Reviewed and stable  Last Vitals:  Vitals Value Taken Time  BP    Temp    Pulse    Resp    SpO2      Last Pain:  Vitals:   05/15/24 0659  TempSrc: Temporal  PainSc: 0-No pain         Complications: No notable events documented.

## 2024-05-15 NOTE — Discharge Instructions (Addendum)

## 2024-05-15 NOTE — Op Note (Signed)
 Indications: Patient with a history of median neuropathy at the wrist with hand weakness refractory to conservative management.  Findings: Severe compression of the median nerve at the transverse carpal ligament  Preoperative Diagnosis:G56.01 Right carpal tunnel syndrome  Postoperative Diagnosis: G56.01 Right carpal tunnel syndrome   Postoperative Diagnosis: same   EBL: Minimal IVF: See anesthesia report Drains: none Disposition:Stable to PACU Complications: none  No foley catheter was placed.   Preoperative Note: patient with a history of progressive right median neuropathy with hand weakness refractory to conservative management.  They had tried rest, padding, and watchful waiting but had continued progressive symptoms.  Given the progression of her median neuropathy plan was made for median nerve decompression  Risk of surgery is discussed and include: Infection, bleeding, wound healing issues, pillar pain nerve injury, pain, failure to relieve the symptoms, need for further surgery.  Procedure:  1) right sided carpal tunnel decompression with ultrasound guidance   Procedure: After obtaining informed consent, the patient taken to the operating room, placed in supine position, monitored anesthesia care was induced.  They were given preoperative antibiotics.  Prepped and draped in the usual fashion.  Comprehensive timeout was performed verifying the patient's name, MRN, planned procedure.  An ultrasound was used with a sterile probe.  We used this to mark out our safety points including the interval between the ulnar artery and median nerve.  We identified the motor branch as well as the first sensory branch which were both in safe position.  We also identified the vascular arcade which was in safe positioning as well.  At this point we placed a block with Marcaine without epinephrine.  We blocked the skin where the incision would be, the superficial sensory median nerve, as well as  the transverse carpal ligament.  Under ultrasound guidance we utilized the local anesthetic to perform a hydrodissection of the nerve from the transverse carpal ligament.  We then prepped the sonexs ultra CTR knife on the back table while the anesthetic set in.  We performed a small linear incision approximately 2 to 3 mm.  We then utilized a Statistician under ultrasound guidance to identify the underside of the transverse carpal ligament.  The fat and connective tissue was dissected off the underside.  We could feel that this was quite thickened and calcified.  Causing severe compression.  Once we had a clear tract we then placed the ultrasound-guided knife into the incision and advanced it through the carpal tunnel.  We verified the safety zones including the median nerve which did not significantly cross over the knife.  We are able to see the first sensory branch which was not crossing the knife.  The artery was also in a safe place.  At this point we divided the transverse carpal ligament under ultrasound guidance, to get a full release it took 3 passes.  The nerve relaxed laterally and was well decompressed.  After the 3 passes we placed a [place]field 4 into the wound and were able to feel a complete dissection of the transverse carpal ligament.  We then irrigated, there was some venous oozing so we utilized surgiflo until we got meticulous hemostasis.  Skin glue was placed on the incision and a Band-Aid was placed on top once this was dried.  No immediate complications.  Sponge and pattie counts were correct at the end of the procedure.   I performed the procedure without an assistant surgeon  Penne MICAEL Sharps, MD/MSCR

## 2024-05-16 ENCOUNTER — Encounter: Payer: Self-pay | Admitting: Neurosurgery

## 2024-05-28 ENCOUNTER — Ambulatory Visit (INDEPENDENT_AMBULATORY_CARE_PROVIDER_SITE_OTHER): Admitting: Physician Assistant

## 2024-05-28 ENCOUNTER — Encounter: Payer: Self-pay | Admitting: Physician Assistant

## 2024-05-28 VITALS — BP 150/108 | Temp 98.1°F | Ht 75.0 in | Wt 350.0 lb

## 2024-05-28 DIAGNOSIS — R202 Paresthesia of skin: Secondary | ICD-10-CM

## 2024-05-28 DIAGNOSIS — G5601 Carpal tunnel syndrome, right upper limb: Secondary | ICD-10-CM

## 2024-05-28 DIAGNOSIS — Z09 Encounter for follow-up examination after completed treatment for conditions other than malignant neoplasm: Secondary | ICD-10-CM

## 2024-05-28 NOTE — Progress Notes (Unsigned)
   REFERRING PHYSICIAN:  Sebastian Beverley NOVAK, Md [address] [place],  [place] [address]  DOS: 05/15/2024  HISTORY OF PRESENT ILLNESS: Hunter Huynh is 2 weeks status post right carpal tunnel release. Overall, he is doing very well.  His symptoms have improved tremendously.  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  He states he is having numbness and tingling that is waking him up at night in his left hand.   Leftr sided symptoms, numbness and tingling anf dwkaing upo at night, acts up wqhen cook/workinfg  PHYSICAL EXAMINATION:  NEUROLOGICAL:  General: In no acute distress.   Awake, alert, oriented to person, place, and time.  Pupils equal round and reactive to light.  Facial tone is symmetric.    Strength: Positive carpal compression test of left hand.  Side Biceps Triceps Deltoid Interossei Grip Wrist Ext. Wrist Flex.  R 5 5 5 5 5 5 5   L 5 5 5 5 5 5 5    Incision c/d/I  Imaging:  No new imaging  Assessment / Plan: Hunter Huynh is doing well after right carpal tunnel release.  He is having symptoms in his contralateral side.  EMG was ordered of his left upper extremity.  Advised patient to use a brace for conservative measures prior to his EMG to help with the symptoms.  Will review EMG results once complete.  Plan to see back in approximately 1 month for 6-week postop visit.    Advised to contact the office if any questions or concerns arise.   Lyle Decamp PA-C Dept of Neurosurgery

## 2024-06-15 ENCOUNTER — Other Ambulatory Visit: Payer: Self-pay

## 2024-06-15 DIAGNOSIS — R202 Paresthesia of skin: Secondary | ICD-10-CM

## 2024-06-17 ENCOUNTER — Other Ambulatory Visit: Payer: Self-pay | Admitting: Physical Medicine and Rehabilitation

## 2024-06-25 ENCOUNTER — Ambulatory Visit: Admitting: Neurosurgery

## 2024-06-25 ENCOUNTER — Encounter: Payer: Self-pay | Admitting: Neurosurgery

## 2024-06-25 VITALS — BP 128/82 | Ht 75.0 in | Wt 350.0 lb

## 2024-06-25 DIAGNOSIS — G5601 Carpal tunnel syndrome, right upper limb: Secondary | ICD-10-CM

## 2024-06-25 DIAGNOSIS — Z09 Encounter for follow-up examination after completed treatment for conditions other than malignant neoplasm: Secondary | ICD-10-CM

## 2024-06-25 DIAGNOSIS — R200 Anesthesia of skin: Secondary | ICD-10-CM

## 2024-06-25 NOTE — Progress Notes (Signed)
   REFERRING PHYSICIAN:  Sebastian Beverley NOVAK, Md [address] [place],  [place] [address]  DOS: 05/15/2024  HISTORY OF PRESENT ILLNESS: RUFINO STAUP is 6 weeks status post right carpal tunnel release. Overall, he is doing very well.  He has had a tremendous improvement in his symptoms.  Overall he feels that he is doing much better.  He is very happy with his outcome.  He is going to get his left hand worked up and wants to reach out to us  afterwards.  PHYSICAL EXAMINATION:  NEUROLOGICAL:  General: In no acute distress.   Awake, alert, oriented to person, place, and time.  Pupils equal round and reactive to light.  Facial tone is symmetric.    Strength:  Side Biceps Triceps Deltoid Interossei Grip Wrist Ext. Wrist Flex.  R 5 5 5 5 5 5 5   L 5 5 5 5 5 5 5    Incision c/d/I  Imaging:  No new imaging  Assessment / Plan: EFTON THOMLEY is doing well after right carpal tunnel release.  Overall he is doing very well.  He is happy with his outcomes.  He is going to get a left-sided workup done to see whether or not he is developing left-sided symptoms.  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  Will plan to follow-up and we will move forward with options based off of what his testing shows.  Penne MICAEL Sharps, MD Dept of Neurosurgery

## 2024-07-12 ENCOUNTER — Other Ambulatory Visit: Payer: Self-pay | Admitting: Physical Medicine and Rehabilitation

## 2024-07-15 ENCOUNTER — Other Ambulatory Visit: Payer: Self-pay | Admitting: Physical Medicine and Rehabilitation

## 2024-07-23 ENCOUNTER — Encounter: Payer: Self-pay | Admitting: Radiology

## 2024-07-31 ENCOUNTER — Ambulatory Visit: Admitting: Neurology

## 2024-08-04 ENCOUNTER — Other Ambulatory Visit: Payer: Self-pay

## 2024-08-04 ENCOUNTER — Encounter (HOSPITAL_COMMUNITY): Payer: Self-pay | Admitting: Emergency Medicine

## 2024-08-04 ENCOUNTER — Emergency Department (HOSPITAL_COMMUNITY)
Admission: EM | Admit: 2024-08-04 | Discharge: 2024-08-04 | Disposition: A | Discharge status: Home / Self Care | Attending: Emergency Medicine | Admitting: Emergency Medicine

## 2024-08-04 DIAGNOSIS — I10 Essential (primary) hypertension: Secondary | ICD-10-CM | POA: Insufficient documentation

## 2024-08-04 DIAGNOSIS — Z79899 Other long term (current) drug therapy: Secondary | ICD-10-CM | POA: Insufficient documentation

## 2024-08-04 DIAGNOSIS — Z760 Encounter for issue of repeat prescription: Secondary | ICD-10-CM | POA: Insufficient documentation

## 2024-08-04 DIAGNOSIS — Z79890 Hormone replacement therapy (postmenopausal): Secondary | ICD-10-CM | POA: Diagnosis not present

## 2024-08-04 DIAGNOSIS — Z8673 Personal history of transient ischemic attack (TIA), and cerebral infarction without residual deficits: Secondary | ICD-10-CM | POA: Insufficient documentation

## 2024-08-04 DIAGNOSIS — E039 Hypothyroidism, unspecified: Secondary | ICD-10-CM | POA: Diagnosis not present

## 2024-08-04 MED ORDER — HYDROCORTISONE 10 MG PO TABS: 10.0000 mg | ORAL_TABLET | ORAL | 0 refills | Status: AC

## 2024-08-04 NOTE — ED Triage Notes (Signed)
 Patient needs Hydrocort refilled.

## 2024-08-04 NOTE — Discharge Instructions (Signed)
 Follow up with endocrinologist as scheduled next month

## 2024-08-04 NOTE — ED Provider Notes (Signed)
 [place] EMERGENCY DEPARTMENT AT [facility] Provider Note   CSN: [id number] Arrival date & time: 08/04/24  1500     Patient presents with: Medication Refill   Hunter Huynh is a 43 y.o. male with past medical history of adrenal insufficiency, hypothyroidism, TIA, HTN, brain tumor, PE presents to emergency department for medication refill of hydrocortisone.  He reports that he attempted to call [facility] endocrinology yesterday and they closed so was unable to call for refill.  He last saw them 2 months ago on 06/12/2024.  He has no medical complaints today    HPI     Prior to Admission medications   Medication Sig Start Date End Date Taking? Authorizing Provider  cyclobenzaprine  (FLEXERIL ) 10 MG tablet Take 1 tablet (10 mg total) by mouth 2 (two) times daily as needed for muscle spasms. 03/05/24   Glendia Rocky SAILOR, PA-C  desmopressin (DDAVP NASAL) 0.01 % solution Place 10 mcg into the nose in the morning and at bedtime. 04/02/22   [provider]  gabapentin  (NEURONTIN ) 300 MG capsule Take 1 capsule (300 mg total) by mouth at bedtime. Patient taking differently: Take 300 mg by mouth at bedtime as needed (nerve pain). 03/06/24   Patel, Donika K, DO  hydrocortisone (CORTEF) 10 MG tablet Take 1-2 tablets (10-20 mg total) by mouth See admin instructions. 20 mg in the morning, 10 mg in the evening 08/04/24   Florencia Zaccaro E, PA  levothyroxine (SYNTHROID) 175 MCG tablet Take 540 mcg by mouth every morning. 03/27/22   [provider]  meloxicam  (MOBIC ) 15 MG tablet Take 1 tablet by mouth once daily 06/18/24   Williams, [name], NP  olmesartan  (BENICAR ) 40 MG tablet Take 1 tablet (40 mg total) by mouth daily. 08/02/23 07/27/24  Sebastian Beverley NOVAK, MD  omeprazole (PRILOSEC) 20 MG capsule Take 20 mg by mouth daily.    [provider]  tadalafil  (CIALIS ) 5 MG tablet Take 1 tablet (5 mg total) by mouth daily. 08/02/23   Sebastian Beverley NOVAK, MD  testosterone  cypionate  (DEPOTESTOSTERONE CYPIONATE) 200 MG/ML injection Inject 200 mg into the muscle every 14 (fourteen) days.    [provider]    Allergies: Patient has no known allergies.    Review of Systems  Respiratory:  Negative for shortness of breath.   Cardiovascular:  Negative for chest pain.  Gastrointestinal:  Negative for abdominal pain, diarrhea and vomiting.  Neurological:  Negative for headaches.  Psychiatric/Behavioral:  Negative for confusion.     Updated Vital Signs BP (!) 145/110 (BP Location: Right Arm)   Pulse 79   Temp 97.7 F (36.5 C) (Oral)   Resp 14   SpO2 98%   Physical Exam Vitals and nursing note reviewed.  Constitutional:      General: He is not in acute distress.    Appearance: Normal appearance.  HENT:     Head: Normocephalic and atraumatic.  Eyes:     Conjunctiva/sclera: Conjunctivae normal.  Cardiovascular:     Rate and Rhythm: Normal rate.  Pulmonary:     Effort: Pulmonary effort is normal. No respiratory distress.  Skin:    Coloration: Skin is not jaundiced or pale.  Neurological:     Mental Status: He is alert. Mental status is at baseline.     (all labs ordered are listed, but only abnormal results are displayed) Labs Reviewed - No data to display  EKG: None  Radiology: No results found.   Medications Ordered in the ED -  No data to display                                  Medical Decision Making Risk Prescription drug management.    Patient presents to the ED for concern of medication refill.   Co morbidities that complicate the patient evaluation  See HPI   Additional history obtained:  Additional history obtained from Nursing and Outside Medical Records   External records from outside source obtained and reviewed including triage note, [facility] endocrinology note from 06/12/2024    Medicines ordered and prescription drug management:  I ordered medication including hydrocortisone for refill I have reviewed the  patients home medicines and have made adjustments as needed    Problem List / ED Course:  Medication refill No medical complaints today Vital signs notable for mild HTN of 149/100.  Tachycardia nor fever. Patient is well-appearing in room in no signs of distress or pain Did review endocrinology note from [facility] on 06/12/24 and confirmed his prescription of hydrocortisone 20mg  in morning and 10mg  at night for total of 30mg  (3 10mg  tabs) daily Will provide 30 day supply to hold him over until he f/u with endocrinology appt that is scheduled in one month. Discussed importance of medication refills to be obtained by prescribing provider and not using ED for this reason. He expresses understanding and reports he is only he here today as he has no tablets left and it is the weekend and unable to call endo office   Reevaluation:  After the interventions noted above, I reevaluated the patient and found that they have :stayed the same    Dispostion:  After consideration of the diagnostic results and the patients response to treatment, I feel that the patent would benefit from outpatient management and endocrinology follow-up.   Discussed ED workup, disposition, return to ED precautions with patient who expresses understanding agrees with plan.  All questions answered to their satisfaction.  They are agreeable to plan.  Discharge instructions provided on paperwork  Final diagnoses:  Medication refill    ED Discharge Orders          Ordered    hydrocortisone (CORTEF) 10 MG tablet  See admin instructions        08/04/24 1610             Minnie Tinnie BRAVO, PA 08/04/24 1617    Freddi Hamilton, MD 08/04/24 [phone]

## 2024-08-08 ENCOUNTER — Encounter: Admitting: Family Medicine

## 2024-08-08 NOTE — Progress Notes (Incomplete)
 Assessment  Assessment/Plan:  Assessment and Plan              There are no discontinued medications.  Patient Counseling(The following topics were reviewed and/or handout was given):  -Nutrition: Stressed importance of moderation in sodium/caffeine intake, saturated fat and cholesterol, caloric balance, sufficient intake of fresh fruits, vegetables, and fiber.  -Stressed the importance of regular exercise.   -Substance Abuse: Discussed cessation/primary prevention of tobacco, alcohol, or other drug use; driving or other dangerous activities under the influence; availability of treatment for abuse.   -Injury prevention: Discussed safety belts, safety helmets, smoke detector, smoking near bedding or upholstery.   -Sexuality: Discussed sexually transmitted diseases, partner selection, use of condoms, avoidance of unintended pregnancy and contraceptive alternatives.   -Dental health: Discussed importance of regular tooth brushing, flossing, and dental visits.  -Health maintenance and immunizations reviewed. Please refer to Health maintenance section.  No follow-ups on file.        Subjective:   Encounter date: 08/08/2024  No chief complaint on file.   Discussed the use of AI scribe software for clinical note transcription with the patient, who gave verbal consent to proceed.  History of Present Illness           Hypertension - Managed on olmesartan  40 mg daily. - Recent blood pressure elevated at 149/100 and 145/110 mmHg.   Leg Swelling - History of bilateral leg swelling, improved in the morning but worsening significantly by the end of the day. - It was thought his Testosterone  may have been contributing, and in an attempt to control swelling, his antihypertensives were changed with discontinuation of amlodipine  5 mg and increasing olmesartan  to 40 mg.      01/11/2023   11:19 AM 11/25/2022   10:03 AM 04/05/2022    8:51 AM  Depression screen PHQ 2/9  Decreased  Interest 0 1 0  Down, Depressed, Hopeless 0 0 0  PHQ - 2 Score 0 1 0  Altered sleeping 0 0   Tired, decreased energy 1 2   Change in appetite 0 1   Feeling bad or failure about yourself  0 1   Trouble concentrating 0 0   Moving slowly or fidgety/restless 0 0   Suicidal thoughts 0 0   PHQ-9 Score 1  5    Difficult doing work/chores Not difficult at all Somewhat difficult      Data saved with a previous flowsheet row definition       01/11/2023   11:19 AM 11/25/2022   10:03 AM  GAD 7 : Generalized Anxiety Score  Nervous, Anxious, on Edge 1 1  Control/stop worrying 1 1  Worry too much - different things 0 1  Trouble relaxing 0 0  Restless 0 0  Easily annoyed or irritable 0 2  Afraid - awful might happen 0 0  Total GAD 7 Score 2 5  Anxiety Difficulty Not difficult at all Somewhat difficult    Health Maintenance Due  Topic Date Due   DTaP/Tdap/Td (1 - Tdap) Never done   Hepatitis B Vaccines 19-59 Average Risk (1 of 3 - 19+ 3-dose series) Never done   HPV VACCINES (1 - 3-dose SCDM series) Never done   Influenza Vaccine  Never done   COVID-19 Vaccine (1 - 2025-26 season) Never done     Dental: ***UTD Vision: ***UTD  PMH:  The following were reviewed and entered/updated in epic: Past Medical History:  Diagnosis Date   Adrenal insufficiency    a.)  on hydrocortisone + DDAVP   Erectile dysfunction    a.) on PDE5i (tadalafil )   GERD (gastroesophageal reflux disease)    History of brain surgery 02/2022   Hypertension    Hypotestosteronism    a.) on TRT injections (depotestosterone cypionate)   Hypothyroidism    PONV (postoperative nausea and vomiting)    Right median nerve neuropathy    T2DM (type 2 diabetes mellitus) (HCC)     Patient Active Problem List   Diagnosis Date Noted   Right carpal tunnel syndrome 04/25/2024   Chronic low back pain with right-sided sciatica 09/01/2023   Class 3 severe obesity with serious comorbidity and body mass index (BMI) of 45.0 to  49.9 in adult [facility]) 08/02/2023   Lower urinary tract symptoms (LUTS) 08/02/2023   Erectile dysfunction due to diseases classified elsewhere 08/02/2023   Exposure to STD 03/04/2023   Testosterone  deficiency 11/25/2022   History of pulmonary embolus (PE) 11/25/2022   Swelling of both hands 11/25/2022   Family history of bipolar disorder 11/25/2022   Generalized edema worse in lower extremities 11/25/2022   Concern about mental disorder without diagnosis 11/25/2022   Brain tumor (HCC) 04/05/2022   Adrenal insufficiency 04/02/2022   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   Primary hypertension 01/15/2022    Past Surgical History:  Procedure Laterality Date   BRAIN SURGERY  01/2022   brain tumor removed  02/2022   CARPAL TUNNEL RELEASE Right 05/15/2024   Procedure: CARPAL TUNNEL RELEASE;  Surgeon: Claudene Penne ORN, MD;  Location: [facility] ORS;  Service: Neurosurgery;  Laterality: Right;  RIGHT CARPAL TUNNEL DECOMPRESSION WITH ULTRASOUND GUIDANCE    Family History  Problem Relation Age of Onset   Hypertension Mother    Asthma Father    Bipolar disorder Sister     Medications- reviewed and updated Outpatient Medications Prior to Visit  Medication Sig Dispense Refill   cyclobenzaprine  (FLEXERIL ) 10 MG tablet Take 1 tablet (10 mg total) by mouth 2 (two) times daily as needed for muscle spasms. 20 tablet 0   desmopressin (DDAVP NASAL) 0.01 % solution Place 10 mcg into the nose in the morning and at bedtime.     gabapentin  (NEURONTIN ) 300 MG capsule Take 1 capsule (300 mg total) by mouth at bedtime. (Patient taking differently: Take 300 mg by mouth at bedtime as needed (nerve pain).) 30 capsule 2   hydrocortisone (CORTEF) 10 MG tablet Take 1-2 tablets (10-20 mg total) by mouth See admin instructions. 20 mg in the morning, 10 mg in the evening 90 tablet 0   levothyroxine (SYNTHROID) 175 MCG tablet Take 540 mcg by mouth every morning.      meloxicam  (MOBIC ) 15 MG tablet Take 1 tablet by mouth once daily 30 tablet 0   olmesartan  (BENICAR ) 40 MG tablet Take 1 tablet (40 mg total) by mouth daily. 90 tablet 3   omeprazole (PRILOSEC) 20 MG capsule Take 20 mg by mouth daily.     tadalafil  (CIALIS ) 5 MG tablet Take 1 tablet (5 mg total) by mouth daily. 30 tablet 11   testosterone  cypionate (DEPOTESTOSTERONE CYPIONATE) 200 MG/ML injection Inject 200 mg into the muscle every 14 (fourteen) days.     No facility-administered medications prior to visit.     No Known Allergies  Social History   Socioeconomic History   Marital status: Married    Spouse name: Azjuana   Number of children: 1   Years of education: Not on file  Highest education level: 12th grade  Occupational History   Not on file  Tobacco Use   Smoking status: Former    Current packs/day: 0.50    Types: Cigarettes    Passive exposure: Never   Smokeless tobacco: Never   Tobacco comments:    Vaping 3 days a week   Vaping Use   Vaping status: Every Day   Substances: Nicotine, Flavoring  Substance and Sexual Activity   Alcohol use: Not Currently   Drug use: Not Currently    Types: Marijuana   Sexual activity: Not on file  Other Topics Concern   Not on file  Social History Narrative   Are you right handed or left handed? Right Handed    Are you currently employed ? Yes   What is your current occupation?   Do you live at home alone? No    Who lives with you? Wife and son    What type of home do you live in: 1 story or 2 story? Lives in a one story floor plan apartment. Has to climb stairs to his apartment.        Social Drivers of Corporate Investment Banker Strain: Low Risk  (08/07/2024)   Overall Financial Resource Strain (CARDIA)    Difficulty of Paying Living Expenses: Not very hard  Food Insecurity: No Food Insecurity (08/07/2024)   Hunger Vital Sign    Worried About Running Out of Food in the Last Year: Never true    Ran Out of Food in the  Last Year: Never true  Transportation Needs: No Transportation Needs (08/07/2024)   PRAPARE - Administrator, Civil Service (Medical): No    Lack of Transportation (Non-Medical): No  Physical Activity: Insufficiently Active (08/07/2024)   Exercise Vital Sign    Days of Exercise per Week: 1 day    Minutes of Exercise per Session: 10 min  Stress: No Stress Concern Present (08/07/2024)   [employer] of Occupational Health - Occupational Stress Questionnaire    Feeling of Stress: Not at all  Social Connections: Socially Integrated (08/07/2024)   Social Connection and Isolation Panel    Frequency of Communication with Friends and Family: More than three times a week    Frequency of Social Gatherings with Friends and Family: Once a week    Attends Religious Services: More than 4 times per year    Active Member of [employer] or Organizations: Yes    Attends Engineer, Structural: More than 4 times per year    Marital Status: Married           Objective:  Physical Exam: There were no vitals taken for this visit.  There is no height or weight on file to calculate BMI. Wt Readings from Last 3 Encounters:  06/25/24 (!) 350 lb (158.8 kg)  05/28/24 (!) 350 lb (158.8 kg)  05/15/24 (!) 355 lb (161 kg)    Physical Exam          Physical Exam      Prior labs:   Recent Results (from the past 2160 hours)  Glucose, capillary     Status: None   Collection Time: 05/15/24  6:59 AM  Result Value Ref Range   Glucose-Capillary 92 70 - 99 mg/dL    Comment: Glucose reference range applies only to samples taken after fasting for at least 8 hours.  Glucose, capillary     Status: Abnormal   Collection Time: 05/15/24  8:27 AM  Result  Value Ref Range   Glucose-Capillary 111 (H) 70 - 99 mg/dL    Comment: Glucose reference range applies only to samples taken after fasting for at least 8 hours.    Lab Results  Component Value Date   CHOL 125 09/01/2023   Lab Results   Component Value Date   HDL 47.20 09/01/2023   Lab Results  Component Value Date   LDLCALC 70 09/01/2023   Lab Results  Component Value Date   TRIG 39.0 09/01/2023   Lab Results  Component Value Date   CHOLHDL 3 09/01/2023   No results found for: LDLDIRECT  Last metabolic panel Lab Results  Component Value Date   GLUCOSE 141 (H) 05/09/2024   NA 138 05/09/2024   K 4.0 05/09/2024   CL 104 05/09/2024   CO2 26 05/09/2024   BUN 10 05/09/2024   CREATININE 1.09 05/09/2024   GFRNONAA >60 05/09/2024   CALCIUM 8.6 (L) 05/09/2024   PROT 7.7 12/19/2023   ALBUMIN 4.0 12/19/2023   BILITOT 0.9 12/19/2023   ALKPHOS 76 12/19/2023   AST 29 12/19/2023   ALT 41 12/19/2023   ANIONGAP 8 05/09/2024    Lab Results  Component Value Date   HGBA1C 6.3 09/01/2023    Last CBC Lab Results  Component Value Date   WBC 6.9 05/09/2024   HGB 13.8 05/09/2024   HCT 41.8 05/09/2024   MCV 83.1 05/09/2024   MCH 27.4 05/09/2024   RDW 14.1 05/09/2024   PLT 342 05/09/2024    Lab Results  Component Value Date   TSH 0.02 (L) 11/25/2022    Lab Results  Component Value Date   PSA 0.32 09/01/2023    Last vitamin D No results found for: MARIEN BOLLS, VD25OH  Lab Results  Component Value Date   BILIRUBINUR NEGATIVE 12/19/2023   PROTEINUR 30 (A) 12/19/2023   UROBILINOGEN 0.2 11/25/2022   LEUKOCYTESUR NEGATIVE 12/19/2023    No results found for: LABMICR, MICROALBUR   At today's visit, we discussed treatment options, associated risk and benefits, and engage in counseling as needed.  Additionally the following were reviewed: Past medical records, past medical and surgical history, family and social background, as well as relevant laboratory results, imaging findings, and specialty notes, where applicable.  This message was generated using dictation software, and as a result, it may contain unintentional typos or errors.  Nevertheless, extensive effort was made to  accurately convey at the pertinent aspects of the patient visit.    There may have been are other unrelated non-urgent complaints, but due to the busy schedule and the amount of time already spent with him, time does not permit to address these issues at today's visit. Another appointment may have or has been requested to review these additional issues.     Beverley KATHEE Hummer, MD  I,Emily Lagle,acting as a scribe for Beverley KATHEE Hummer, MD.,have documented all relevant documentation on the behalf of Beverley KATHEE Hummer, MD.  LILLETTE Beverley KATHEE Hummer, MD, have reviewed all documentation for this visit. The documentation on 08/08/2024 for the exam, diagnosis, procedures, and orders are all accurate and complete.

## 2024-08-17 ENCOUNTER — Emergency Department (HOSPITAL_COMMUNITY)
Admission: EM | Admit: 2024-08-17 | Discharge: 2024-08-17 | Disposition: A | Discharge status: Home / Self Care | Attending: Emergency Medicine | Admitting: Emergency Medicine

## 2024-08-17 ENCOUNTER — Other Ambulatory Visit: Payer: Self-pay

## 2024-08-17 DIAGNOSIS — J029 Acute pharyngitis, unspecified: Secondary | ICD-10-CM | POA: Diagnosis present

## 2024-08-17 DIAGNOSIS — E039 Hypothyroidism, unspecified: Secondary | ICD-10-CM | POA: Diagnosis not present

## 2024-08-17 DIAGNOSIS — Z79899 Other long term (current) drug therapy: Secondary | ICD-10-CM | POA: Insufficient documentation

## 2024-08-17 LAB — RESP PANEL BY RT-PCR (RSV, FLU A&B, COVID)  RVPGX2
Influenza A by PCR: NEGATIVE
Influenza B by PCR: NEGATIVE
Resp Syncytial Virus by PCR: NEGATIVE
SARS Coronavirus 2 by RT PCR: NEGATIVE

## 2024-08-17 LAB — GROUP A STREP BY PCR: Group A Strep by PCR: NOT DETECTED

## 2024-08-17 NOTE — Discharge Instructions (Addendum)
 Check your chart for covid and flu results.  Tylenol  every four hours for fever. Return if any problems.

## 2024-08-17 NOTE — ED Triage Notes (Signed)
 Sore throat x 2 days, denies fever chills, concerned for  strep

## 2024-08-17 NOTE — ED Provider Notes (Signed)
 [place] [facility]GENC[facility] DEPARTMENT AT [facility] Provider Note   CSN: [id number] Arrival date & time: 08/17/24  [id number]     Patient presents with: Sore Throat   Hunter Huynh is a 43 y.o. male.   Patient complains of pain in his throat.  Patient thinks that he has strep throat.  Patient reports he has also had a runny nose symptoms began yesterday.  Patient does not feel like he has had a fever or chills he is not having any shortness of breath.  Patient denies any cough.  Patient has not had any nausea or vomiting.  Patient has a past medical history of a brain tumor adrenal insufficiency PAD hypothyroidism Previous pulmonary embolism and testosterone  deficiency  The history is provided by the patient. No language interpreter was used.  Sore Throat This is a new problem. The problem occurs constantly. The problem has not changed since onset.Nothing aggravates the symptoms. Nothing relieves the symptoms.       Prior to Admission medications   Medication Sig Start Date End Date Taking? Authorizing Provider  cyclobenzaprine  (FLEXERIL ) 10 MG tablet Take 1 tablet (10 mg total) by mouth 2 (two) times daily as needed for muscle spasms. 03/05/24   Glendia Rocky SAILOR, PA-C  desmopressin (DDAVP NASAL) 0.01 % solution Place 10 mcg into the nose in the morning and at bedtime. 04/02/22   [provider]  gabapentin  (NEURONTIN ) 300 MG capsule Take 1 capsule (300 mg total) by mouth at bedtime. Patient taking differently: Take 300 mg by mouth at bedtime as needed (nerve pain). 03/06/24   Patel, Donika K, DO  hydrocortisone  (CORTEF ) 10 MG tablet Take 1-2 tablets (10-20 mg total) by mouth See admin instructions. 20 mg in the morning, 10 mg in the evening 08/04/24   Baron, Lauren E, PA  levothyroxine (SYNTHROID) 175 MCG tablet Take 540 mcg by mouth every morning. 03/27/22   [provider]  meloxicam  (MOBIC ) 15 MG tablet Take 1 tablet by mouth once daily 06/18/24   Williams, [name], NP   olmesartan  (BENICAR ) 40 MG tablet Take 1 tablet (40 mg total) by mouth daily. 08/02/23 07/27/24  Sebastian Beverley NOVAK, MD  omeprazole (PRILOSEC) 20 MG capsule Take 20 mg by mouth daily.    [provider]  tadalafil  (CIALIS ) 5 MG tablet Take 1 tablet (5 mg total) by mouth daily. 08/02/23   Sebastian Beverley NOVAK, MD  testosterone  cypionate (DEPOTESTOSTERONE CYPIONATE) 200 MG/ML injection Inject 200 mg into the muscle every 14 (fourteen) days.    [provider]    Allergies: Patient has no known allergies.    Review of Systems  All other systems reviewed and are negative.   Updated Vital Signs BP (!) 144/90   Pulse (!) 103   Temp 97.7 F (36.5 C) (Oral)   Resp 17   SpO2 94%   Physical Exam Vitals and nursing note reviewed.  Constitutional:      Appearance: He is well-developed.  HENT:     Head: Normocephalic.     Mouth/Throat:     Pharynx: Pharyngeal swelling and posterior oropharyngeal erythema present.     Tonsils: No tonsillar exudate.  Cardiovascular:     Rate and Rhythm: Normal rate.  Pulmonary:     Effort: Pulmonary effort is normal.  Abdominal:     General: There is no distension.  Musculoskeletal:        General: Normal range of motion.     Cervical back: Normal range of motion.  Skin:    General: Skin is warm.  Neurological:     Mental Status: He is alert and oriented to person, place, and time.     (all labs ordered are listed, but only abnormal results are displayed) Labs Reviewed  GROUP A STREP BY PCR  RESP PANEL BY RT-PCR (RSV, FLU A&B, COVID)  [id number]    EKG: None  Radiology: No results found.   Procedures   Medications Ordered in the ED - No data to display                                  Medical Decision Making Pt complains of a sore throat   Amount and/or Complexity of Data Reviewed Labs: ordered. Decision-making details documented in ED Course.    Details: Strep is negative covid pending         Final diagnoses:   Pharyngitis, unspecified etiology    ED Discharge Orders     None      An After Visit Summary was printed and given to the patient.     Flint Sonny POUR, PA-C 08/17/24 [id number]    Zammit, Joseph, MD 08/17/24 [phone]

## 2024-08-21 ENCOUNTER — Encounter: Admitting: Family Medicine

## 2024-08-22 ENCOUNTER — Encounter: Payer: Self-pay | Admitting: Neurosurgery

## 2024-08-23 ENCOUNTER — Encounter: Admitting: Neurology

## 2024-08-23 ENCOUNTER — Encounter: Payer: Self-pay | Admitting: Neurology

## 2024-08-23 DIAGNOSIS — Z029 Encounter for administrative examinations, unspecified: Secondary | ICD-10-CM

## 2024-08-28 ENCOUNTER — Other Ambulatory Visit: Payer: Self-pay | Admitting: Family Medicine

## 2024-08-28 DIAGNOSIS — E232 Diabetes insipidus: Secondary | ICD-10-CM

## 2024-09-27 ENCOUNTER — Other Ambulatory Visit: Payer: Self-pay | Admitting: Family Medicine

## 2024-09-27 DIAGNOSIS — E232 Diabetes insipidus: Secondary | ICD-10-CM

## 2024-10-01 ENCOUNTER — Ambulatory Visit: Admitting: Family Medicine

## 2024-10-01 VITALS — BP 138/82 | HR 96 | Temp 98.2°F | Ht 75.0 in | Wt 365.6 lb

## 2024-10-01 DIAGNOSIS — Z0000 Encounter for general adult medical examination without abnormal findings: Secondary | ICD-10-CM

## 2024-10-01 DIAGNOSIS — B351 Tinea unguium: Secondary | ICD-10-CM | POA: Diagnosis not present

## 2024-10-01 DIAGNOSIS — Z9189 Other specified personal risk factors, not elsewhere classified: Secondary | ICD-10-CM | POA: Diagnosis not present

## 2024-10-01 DIAGNOSIS — Z6841 Body Mass Index (BMI) 40.0 and over, adult: Secondary | ICD-10-CM | POA: Diagnosis not present

## 2024-10-01 DIAGNOSIS — I10 Essential (primary) hypertension: Secondary | ICD-10-CM | POA: Diagnosis not present

## 2024-10-01 DIAGNOSIS — E661 Drug-induced obesity: Secondary | ICD-10-CM

## 2024-10-01 DIAGNOSIS — E232 Diabetes insipidus: Secondary | ICD-10-CM

## 2024-10-01 DIAGNOSIS — N521 Erectile dysfunction due to diseases classified elsewhere: Secondary | ICD-10-CM

## 2024-10-01 DIAGNOSIS — E66813 Obesity, class 3: Secondary | ICD-10-CM

## 2024-10-01 LAB — CBC WITH DIFFERENTIAL/PLATELET
Basophils Absolute: 0 K/uL (ref 0.0–0.1)
Basophils Relative: 0.7 % (ref 0.0–3.0)
Eosinophils Absolute: 0.1 K/uL (ref 0.0–0.7)
Eosinophils Relative: 2.3 % (ref 0.0–5.0)
HCT: 42.7 % (ref 39.0–52.0)
Hemoglobin: 14 g/dL (ref 13.0–17.0)
Lymphocytes Relative: 37.1 % (ref 12.0–46.0)
Lymphs Abs: 2.4 K/uL (ref 0.7–4.0)
MCHC: 32.7 g/dL (ref 30.0–36.0)
MCV: 82.6 fl (ref 78.0–100.0)
Monocytes Absolute: 0.7 K/uL (ref 0.1–1.0)
Monocytes Relative: 10.7 % (ref 3.0–12.0)
Neutro Abs: 3.2 K/uL (ref 1.4–7.7)
Neutrophils Relative %: 49.2 % (ref 43.0–77.0)
Platelets: 344 K/uL (ref 150.0–400.0)
RBC: 5.18 Mil/uL (ref 4.22–5.81)
RDW: 14.9 % (ref 11.5–15.5)
WBC: 6.5 K/uL (ref 4.0–10.5)

## 2024-10-01 LAB — HEMOGLOBIN A1C: Hgb A1c MFr Bld: 6.7 % — ABNORMAL HIGH (ref 4.6–6.5)

## 2024-10-01 LAB — COMPREHENSIVE METABOLIC PANEL WITH GFR
ALT: 27 U/L (ref 3–53)
AST: 19 U/L (ref 5–37)
Albumin: 4.2 g/dL (ref 3.5–5.2)
Alkaline Phosphatase: 73 U/L (ref 39–117)
BUN: 14 mg/dL (ref 6–23)
CO2: 30 meq/L (ref 19–32)
Calcium: 9.2 mg/dL (ref 8.4–10.5)
Chloride: 100 meq/L (ref 96–112)
Creatinine, Ser: 1.18 mg/dL (ref 0.40–1.50)
GFR: 76.66 mL/min
Glucose, Bld: 103 mg/dL — ABNORMAL HIGH (ref 70–99)
Potassium: 3.9 meq/L (ref 3.5–5.1)
Sodium: 137 meq/L (ref 135–145)
Total Bilirubin: 0.5 mg/dL (ref 0.2–1.2)
Total Protein: 7.5 g/dL (ref 6.0–8.3)

## 2024-10-01 LAB — LIPID PANEL
Cholesterol: 158 mg/dL (ref 28–200)
HDL: 42.1 mg/dL
LDL Cholesterol: 95 mg/dL (ref 10–99)
NonHDL: 116.02
Total CHOL/HDL Ratio: 4
Triglycerides: 103 mg/dL (ref 10.0–149.0)
VLDL: 20.6 mg/dL (ref 0.0–40.0)

## 2024-10-01 LAB — MICROALBUMIN / CREATININE URINE RATIO
Creatinine,U: 243.6 mg/dL
Microalb Creat Ratio: 8.4 mg/g (ref 0.0–30.0)
Microalb, Ur: 2 mg/dL — ABNORMAL HIGH (ref 0.7–1.9)

## 2024-10-01 MED ORDER — TADALAFIL 5 MG PO TABS: 5.0000 mg | ORAL_TABLET | Freq: Every day | ORAL | 0 refills | Status: AC

## 2024-10-01 NOTE — Progress Notes (Signed)
 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 - Continue desmopressin, testosterone  replacement, hydrocortisone , and levothyroxine as prescribed.  Hypertension Well-controlled with current medication regimen. Blood pressure is 138/82 mmHg. - Continue current antihypertensive medication regimen. - Scheduled follow-up for blood pressure check in six months.  Morbid obesity BMI of 45.7, secondary to endocrine conditions. He wants to try to manage his weight himself first before seeking medical management. - Encouraged continued lifestyle modifications for weight management. - Discussed potential future options for pharmacological interventions if lifestyle changes are insufficient.  Onychomycosis and onychocryptosis Thick, yellow toenails likely due to onychomycosis and onychocryptosis, possibly exacerbated by footwear and neuropathy. No loss of sensation in feet reported. - Referred to podiatry for management of onychomycosis and onychocryptosis.  Erectile dysfunction Managed with tadalafil  5 mg as needed, which is effective. - Refilled tadalafil  prescription.  Suspected obstructive sleep apnea Due to snoring. He missed a previous sleep study appointment but is interested in pursuing it. Discussed potential benefits of weight loss medications for sleep apnea if diagnosed, though insurance coverage may vary. - Referred for sleep study to evaluate for obstructive sleep apnea.  General health maintenance Discussed vaccinations including tetanus, hepatitis B, and HPV. He is interested in receiving these vaccinations to prevent diseases and protect loved ones. - Suggested tetanus, hepatitis B, and HPV  vaccinations.       Medications Discontinued During This Encounter  Medication Reason   tadalafil  (CIALIS ) 5 MG tablet Reorder    Patient Counseling(The following topics were reviewed and/or handout was given):  -Nutrition: Stressed importance of moderation in sodium/caffeine intake, saturated fat and cholesterol, caloric balance, sufficient intake of fresh fruits, vegetables, and fiber.  -Stressed the importance of regular exercise.   -Substance Abuse: Discussed cessation/primary prevention of tobacco, alcohol, or other drug use; driving or other dangerous activities under the influence; availability of treatment for abuse.   -Injury prevention: Discussed safety belts, safety helmets, smoke detector, smoking near bedding or upholstery.   -Sexuality: Discussed sexually transmitted diseases, partner selection, use of condoms, avoidance of unintended pregnancy and contraceptive alternatives.   -Dental health: Discussed importance of regular tooth brushing, flossing, and dental visits.  -Health maintenance and immunizations reviewed. Please refer to Health maintenance section.  No follow-ups on file.        Subjective:   Encounter date: 10/01/2024  Chief Complaint  Patient presents with   Annual Exam    Pt presents today for a CPE. States he needs a referral for podiatry.     Discussed the use of AI scribe software for clinical note transcription with the patient, who gave verbal consent to proceed.  History of Present Illness Hunter Huynh is a 44 year old male with panhypopituitarism who presents for a follow-up visit.  Panhypopituitarism and hormone replacement therapy - Panhypopituitarism managed with multiple hormone replacements: desmopressin for diabetes insipidus, testosterone  for hypogonadism, hydrocortisone  for adrenal insufficiency, and levothyroxine for hypothyroidism.  Hypertension - Hypertension managed with amlodipine  40 mg daily. - Blood pressure  maintained around 138/82 mmHg.  Morbid obesity - Morbid obesity with BMI of 45.7. - Attributes weight gain to endocrine condition. - Attempting weight management through lifestyle changes, including reducing sugary drinks and snacks.  Erectile dysfunction - Erectile dysfunction managed with tadalafil  5 mg as needed, with good effect.  Peripheral neuropathy - Neuropathy  managed with gabapentin  300 mg at night as needed. - No loss of sensation in feet. - Neurology involved in ongoing care.  Onychomycosis - Thick and yellow toenails.  Sleep-disordered breathing - Snoring at night. - Missed previously scheduled sleep study appointment. - Interested in rescheduling sleep study for further evaluation.  Postoperative recovery and immunocompetence - History of brain surgery. - Even a common cold results in being bedridden for two days, indicating significant impact on recovery and daily functioning.       10/01/2024    2:07 PM 10/01/2024    1:44 PM 01/11/2023   11:19 AM 11/25/2022   10:03 AM 04/05/2022    8:51 AM  Depression screen PHQ 2/9  Decreased Interest 3 0 0 1 0  Down, Depressed, Hopeless 0 0 0 0 0  PHQ - 2 Score 3 0 0 1 0  Altered sleeping 2  0 0   Tired, decreased energy 1  1 2    Change in appetite 0  0 1   Feeling bad or failure about yourself  0  0 1   Trouble concentrating 0  0 0   Moving slowly or fidgety/restless 0  0 0   Suicidal thoughts 0  0 0   PHQ-9 Score 6  1  5     Difficult doing work/chores   Not difficult at all Somewhat difficult      Data saved with a previous flowsheet row definition       10/01/2024    2:07 PM 01/11/2023   11:19 AM 11/25/2022   10:03 AM  GAD 7 : Generalized Anxiety Score  Nervous, Anxious, on Edge 0 1 1  Control/stop worrying 0 1 1  Worry too much - different things 0 0 1  Trouble relaxing 0 0 0  Restless 0 0 0  Easily annoyed or irritable 0 0 2  Afraid - awful might happen 0 0 0  Total GAD 7 Score 0 2 5  Anxiety Difficulty  Not difficult at all Not difficult at all Somewhat difficult    Health Maintenance Due  Topic Date Due   DTaP/Tdap/Td (1 - Tdap) Never done   Hepatitis B Vaccines 19-59 Average Risk (1 of 3 - 19+ 3-dose series) Never done   HPV VACCINES (1 - Risk 3-dose SCDM series) Never done      PMH:  The following were reviewed and entered/updated in epic: Past Medical History:  Diagnosis Date   Adrenal insufficiency    a.) on hydrocortisone  + DDAVP   Erectile dysfunction    a.) on PDE5i (tadalafil )   GERD (gastroesophageal reflux disease)    History of brain surgery 02/2022   Hypertension    Hypotestosteronism    a.) on TRT injections (depotestosterone cypionate)   Hypothyroidism    PONV (postoperative nausea and vomiting)    Right median nerve neuropathy    T2DM (type 2 diabetes mellitus) (HCC)     Patient Active Problem List   Diagnosis Date Noted   Right carpal tunnel syndrome 04/25/2024   Chronic low back pain with right-sided sciatica 09/01/2023   Class 3 severe obesity with serious comorbidity and body mass index (BMI) of 45.0 to 49.9 in adult [facility]) 08/02/2023   Lower urinary tract symptoms (LUTS) 08/02/2023   Erectile dysfunction due to diseases classified elsewhere 08/02/2023   Exposure to STD 03/04/2023   Testosterone  deficiency 11/25/2022   History of pulmonary embolus (PE) 11/25/2022   Swelling of both hands 11/25/2022   Family history of  bipolar disorder 11/25/2022   Generalized edema worse in lower extremities 11/25/2022   Concern about mental disorder without diagnosis 11/25/2022   Brain tumor (HCC) 04/05/2022   Adrenal insufficiency 04/02/2022   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   Primary hypertension 01/15/2022    Past Surgical History:  Procedure Laterality Date   BRAIN SURGERY  01/2022   brain tumor removed  02/2022   CARPAL TUNNEL RELEASE Right 05/15/2024   Procedure: CARPAL TUNNEL RELEASE;   Surgeon: Claudene Penne ORN, MD;  Location: [facility] ORS;  Service: Neurosurgery;  Laterality: Right;  RIGHT CARPAL TUNNEL DECOMPRESSION WITH ULTRASOUND GUIDANCE    Family History  Problem Relation Age of Onset   Hypertension Mother    Asthma Father    Bipolar disorder Sister     Medications- reviewed and updated Outpatient Medications Prior to Visit  Medication Sig Dispense Refill   desmopressin (DDAVP NASAL) 0.01 % solution Place 10 mcg into the nose in the morning and at bedtime.     gabapentin  (NEURONTIN ) 300 MG capsule Take 1 capsule (300 mg total) by mouth at bedtime. (Patient taking differently: Take 300 mg by mouth at bedtime as needed (nerve pain).) 30 capsule 2   hydrocortisone  (CORTEF ) 10 MG tablet Take 1-2 tablets (10-20 mg total) by mouth See admin instructions. 20 mg in the morning, 10 mg in the evening 90 tablet 0   levothyroxine (SYNTHROID) 175 MCG tablet Take 540 mcg by mouth every morning.     olmesartan  (BENICAR ) 40 MG tablet Take 1 tablet (40 mg total) by mouth daily. 90 tablet 3   omeprazole (PRILOSEC) 20 MG capsule Take 20 mg by mouth daily.     testosterone  cypionate (DEPOTESTOSTERONE CYPIONATE) 200 MG/ML injection Inject 200 mg into the muscle every 14 (fourteen) days.     tadalafil  (CIALIS ) 5 MG tablet Take 1 tablet by mouth once daily 30 tablet 0   cyclobenzaprine  (FLEXERIL ) 10 MG tablet Take 1 tablet (10 mg total) by mouth 2 (two) times daily as needed for muscle spasms. 20 tablet 0   meloxicam  (MOBIC ) 15 MG tablet Take 1 tablet by mouth once daily (Patient not taking: Reported on 10/01/2024) 30 tablet 0   No facility-administered medications prior to visit.    Allergies[1]  Social History   Socioeconomic History   Marital status: Married    Spouse name: Azjuana   Number of children: 1   Years of education: Not on file   Highest education level: 12th grade  Occupational History   Not on file  Tobacco Use   Smoking status: Former    Current packs/day: 0.50     Types: Cigarettes    Passive exposure: Never   Smokeless tobacco: Never   Tobacco comments:    Vaping 3 days a week   Vaping Use   Vaping status: Every Day   Substances: Nicotine, Flavoring  Substance and Sexual Activity   Alcohol use: Not Currently   Drug use: Not Currently    Types: Marijuana   Sexual activity: Not on file  Other Topics Concern   Not on file  Social History Narrative   Are you right handed or left handed? Right Handed    Are you currently employed ? Yes   What is your current occupation?   Do you live at home alone? No    Who lives with you? Wife and son    What type of home do you live in: 1 story or  2 story? Lives in a one story floor plan apartment. Has to climb stairs to his apartment.        Social Drivers of Health   Tobacco Use: Medium Risk (08/04/2024)   Patient History    Smoking Tobacco Use: Former    Smokeless Tobacco Use: Never    Passive Exposure: Never  Physicist, Medical Strain: Low Risk (08/07/2024)   Overall Financial Resource Strain (CARDIA)    Difficulty of Paying Living Expenses: Not very hard  Food Insecurity: No Food Insecurity (08/07/2024)   Epic    Worried About Programme Researcher, Broadcasting/film/video in the Last Year: Never true    Ran Out of Food in the Last Year: Never true  Transportation Needs: No Transportation Needs (08/07/2024)   Epic    Lack of Transportation (Medical): No    Lack of Transportation (Non-Medical): No  Physical Activity: Insufficiently Active (08/07/2024)   Exercise Vital Sign    Days of Exercise per Week: 1 day    Minutes of Exercise per Session: 10 min  Stress: No Stress Concern Present (08/07/2024)   [employer] of Occupational Health - Occupational Stress Questionnaire    Feeling of Stress: Not at all  Social Connections: Socially Integrated (08/07/2024)   Social Connection and Isolation Panel    Frequency of Communication with Friends and Family: More than three times a week    Frequency of Social  Gatherings with Friends and Family: Once a week    Attends Religious Services: More than 4 times per year    Active Member of Clubs or Organizations: Yes    Attends Banker Meetings: More than 4 times per year    Marital Status: Married  Depression (PHQ2-9): Medium Risk (10/01/2024)   Depression (PHQ2-9)    PHQ-2 Score: 6  Alcohol Screen: Low Risk (09/01/2023)   Alcohol Screen    Last Alcohol Screening Score (AUDIT): 0  Housing: Low Risk (08/07/2024)   Epic    Unable to Pay for Housing in the Last Year: No    Number of Times Moved in the Last Year: 0    Homeless in the Last Year: No  Utilities: Not on file  Health Literacy: Not on file           Objective:  Physical Exam: BP 138/82   Pulse 96   Temp 98.2 F (36.8 C)   Ht 6' 3 (1.905 m)   Wt (!) 365 lb 9.6 oz (165.8 kg)   SpO2 94%   BMI 45.70 kg/m   Body mass index is 45.7 kg/m. Wt Readings from Last 3 Encounters:  10/01/24 (!) 365 lb 9.6 oz (165.8 kg)  06/25/24 (!) 350 lb (158.8 kg)  05/28/24 (!) 350 lb (158.8 kg)    Physical Exam GENERAL: Alert, cooperative, well developed, no acute distress. HEENT: Normocephalic, normal oropharynx, moist mucous membranes. CHEST: Clear to auscultation bilaterally, no wheezes, rhonchi, or crackles. CARDIOVASCULAR: Normal heart rate and rhythm, S1 and S2 normal without murmurs. ABDOMEN: Soft, non-tender, non-distended, without organomegaly, normal bowel sounds. EXTREMITIES: Thick yellowing toe nails bilateral NEUROLOGICAL: Cranial nerves grossly intact, moves all extremities without gross motor or sensory deficit.  Physical Exam      Prior labs:   Recent Results (from the past 2160 hours)  Group A Strep by PCR if patient complains of sore throat.     Status: None   Collection Time: 08/17/24  6:53 AM   Specimen: Throat; Sterile Swab  Result Value Ref Range   Group  A Strep by PCR NOT DETECTED NOT DETECTED    Comment: Performed at [facility], [address] [place], [place] [address]  Resp panel by RT-PCR (RSV, Flu A&B, Covid) Anterior Nasal Swab     Status: None   Collection Time: 08/17/24  8:01 AM   Specimen: Anterior Nasal Swab  Result Value Ref Range   SARS Coronavirus 2 by RT PCR NEGATIVE NEGATIVE    Comment: (NOTE) SARS-CoV-2 target nucleic acids are NOT DETECTED.  The SARS-CoV-2 RNA is generally detectable in upper respiratory specimens during the acute phase of infection. The lowest concentration of SARS-CoV-2 viral copies this assay can detect is 138 copies/mL. A negative result does not preclude SARS-Cov-2 infection and should not be used as the sole basis for treatment or other patient management decisions. A negative result may occur with  improper specimen collection/handling, submission of specimen other than nasopharyngeal swab, presence of viral mutation(s) within the areas targeted by this assay, and inadequate number of viral copies(<138 copies/mL). A negative result must be combined with clinical observations, patient history, and epidemiological information. The expected result is Negative.  Fact Sheet for Patients:  bloggercourse.com  Fact Sheet for Healthcare Providers:  seriousbroker.it  This test is no t yet approved or cleared by the [place]  FDA and  has been authorized for detection and/or diagnosis of SARS-CoV-2 by FDA under an Emergency Use Authorization (EUA). This EUA will remain  in effect (meaning this test can be used) for the duration of the COVID-19 declaration under Section 564(b)(1) of the Act, 21 U.S.C.section 360bbb-3(b)(1), unless the authorization is terminated  or revoked sooner.       Influenza A by PCR NEGATIVE NEGATIVE   Influenza B by PCR NEGATIVE NEGATIVE    Comment: (NOTE) The Xpert Xpress SARS-CoV-2/FLU/RSV plus assay is intended as an aid in the diagnosis of influenza from Nasopharyngeal swab  specimens and should not be used as a sole basis for treatment. Nasal washings and aspirates are unacceptable for Xpert Xpress SARS-CoV-2/FLU/RSV testing.  Fact Sheet for Patients: bloggercourse.com  Fact Sheet for Healthcare Providers: seriousbroker.it  This test is not yet approved or cleared by the [place]  FDA and has been authorized for detection and/or diagnosis of SARS-CoV-2 by FDA under an Emergency Use Authorization (EUA). This EUA will remain in effect (meaning this test can be used) for the duration of the COVID-19 declaration under Section 564(b)(1) of the Act, 21 U.S.C. section 360bbb-3(b)(1), unless the authorization is terminated or revoked.     Resp Syncytial Virus by PCR NEGATIVE NEGATIVE    Comment: (NOTE) Fact Sheet for Patients: bloggercourse.com  Fact Sheet for Healthcare Providers: seriousbroker.it  This test is not yet approved or cleared by the [place]  FDA and has been authorized for detection and/or diagnosis of SARS-CoV-2 by FDA under an Emergency Use Authorization (EUA). This EUA will remain in effect (meaning this test can be used) for the duration of the COVID-19 declaration under Section 564(b)(1) of the Act, 21 U.S.C. section 360bbb-3(b)(1), unless the authorization is terminated or revoked.  Performed at [facility], [address], [place], [place] [address]     Lab Results  Component Value Date   CHOL 125 09/01/2023   Lab Results  Component Value Date   HDL 47.20 09/01/2023   Lab Results  Component Value Date   LDLCALC 70 09/01/2023   Lab Results  Component Value Date   TRIG 39.0 09/01/2023   Lab Results  Component  Value Date   CHOLHDL 3 09/01/2023   No results found for: LDLDIRECT  Last metabolic panel Lab Results  Component Value Date   GLUCOSE 141 (H) 05/09/2024   NA 138 05/09/2024    K 4.0 05/09/2024   CL 104 05/09/2024   CO2 26 05/09/2024   BUN 10 05/09/2024   CREATININE 1.09 05/09/2024   GFRNONAA >60 05/09/2024   CALCIUM 8.6 (L) 05/09/2024   PROT 7.7 12/19/2023   ALBUMIN 4.0 12/19/2023   BILITOT 0.9 12/19/2023   ALKPHOS 76 12/19/2023   AST 29 12/19/2023   ALT 41 12/19/2023   ANIONGAP 8 05/09/2024    Lab Results  Component Value Date   HGBA1C 6.3 09/01/2023    Last CBC Lab Results  Component Value Date   WBC 6.9 05/09/2024   HGB 13.8 05/09/2024   HCT 41.8 05/09/2024   MCV 83.1 05/09/2024   MCH 27.4 05/09/2024   RDW 14.1 05/09/2024   PLT 342 05/09/2024    Lab Results  Component Value Date   TSH 0.02 (L) 11/25/2022    Lab Results  Component Value Date   PSA 0.32 09/01/2023    Last vitamin D No results found for: MARIEN BOLLS, VD25OH  Lab Results  Component Value Date   BILIRUBINUR NEGATIVE 12/19/2023   PROTEINUR 30 (A) 12/19/2023   UROBILINOGEN 0.2 11/25/2022   LEUKOCYTESUR NEGATIVE 12/19/2023    No results found for: LABMICR, MICROALBUR   At today's visit, we discussed treatment options, associated risk and benefits, and engage in counseling as needed.  Additionally the following were reviewed: Past medical records, past medical and surgical history, family and social background, as well as relevant laboratory results, imaging findings, and specialty notes, where applicable.  This message was generated using dictation software, and as a result, it may contain unintentional typos or errors.  Nevertheless, extensive effort was made to accurately convey at the pertinent aspects of the patient visit.    There may have been are other unrelated non-urgent complaints, but due to the busy schedule and the amount of time already spent with him, time does not permit to address these issues at today's visit. Another appointment may have or has been requested to review these additional issues.     Arvella Hummer, MD, MS      [1] No Known Allergies  "

## 2024-10-02 ENCOUNTER — Ambulatory Visit: Payer: Self-pay | Admitting: Family Medicine

## 2024-10-02 DIAGNOSIS — E0829 Diabetes mellitus due to underlying condition with other diabetic kidney complication: Secondary | ICD-10-CM | POA: Insufficient documentation

## 2024-10-02 DIAGNOSIS — E1169 Type 2 diabetes mellitus with other specified complication: Secondary | ICD-10-CM | POA: Insufficient documentation

## 2024-10-02 LAB — NO CULTURE INDICATED

## 2024-10-02 LAB — URINALYSIS W MICROSCOPIC + REFLEX CULTURE
Bacteria, UA: NONE SEEN /HPF
Bilirubin Urine: NEGATIVE
Glucose, UA: NEGATIVE
Hgb urine dipstick: NEGATIVE
Hyaline Cast: NONE SEEN /LPF
Leukocyte Esterase: NEGATIVE
Nitrites, Initial: NEGATIVE
Protein, ur: NEGATIVE
Specific Gravity, Urine: 1.026 (ref 1.001–1.035)
Squamous Epithelial / HPF: NONE SEEN /HPF
WBC, UA: NONE SEEN /HPF (ref 0–5)
pH: 7 (ref 5.0–8.0)

## 2024-10-02 NOTE — Progress Notes (Signed)
 Pt reviewed via MyChart and has appointment on 11/05/24 @ 8:40 AM.

## 2024-10-04 ENCOUNTER — Encounter: Admitting: Family Medicine

## 2024-10-15 ENCOUNTER — Ambulatory Visit: Admitting: Podiatry

## 2024-10-22 ENCOUNTER — Ambulatory Visit: Admitting: Podiatry

## 2024-11-05 ENCOUNTER — Ambulatory Visit: Admitting: Family Medicine

## 2024-11-05 ENCOUNTER — Ambulatory Visit: Admitting: Podiatry

## 2024-11-26 ENCOUNTER — Institutional Professional Consult (permissible substitution): Admitting: Neurology

## 2024-12-11 ENCOUNTER — Ambulatory Visit: Admitting: Neurology

## 2025-04-01 ENCOUNTER — Ambulatory Visit: Admitting: Family Medicine
